# Patient Record
Sex: Female | Born: 1935
Health system: Southern US, Community
[De-identification: ages and names within clinical notes are randomized; demographics above are authoritative.]

## PROBLEM LIST (undated history)

## (undated) DIAGNOSIS — I10 Essential (primary) hypertension: Secondary | ICD-10-CM

## (undated) DIAGNOSIS — E119 Type 2 diabetes mellitus without complications: Secondary | ICD-10-CM

## (undated) DIAGNOSIS — R011 Cardiac murmur, unspecified: Secondary | ICD-10-CM

## (undated) DIAGNOSIS — I739 Peripheral vascular disease, unspecified: Secondary | ICD-10-CM

## (undated) DIAGNOSIS — G629 Polyneuropathy, unspecified: Secondary | ICD-10-CM

## (undated) DIAGNOSIS — E785 Hyperlipidemia, unspecified: Secondary | ICD-10-CM

## (undated) DIAGNOSIS — I340 Nonrheumatic mitral (valve) insufficiency: Secondary | ICD-10-CM

## (undated) HISTORY — DX: Cardiac murmur, unspecified: R01.1

## (undated) HISTORY — DX: Peripheral vascular disease, unspecified: I73.9

## (undated) HISTORY — DX: Essential (primary) hypertension: I10

## (undated) HISTORY — DX: Type 2 diabetes mellitus without complications: E11.9

## (undated) HISTORY — DX: Hyperlipidemia, unspecified: E78.5

## (undated) HISTORY — DX: Polyneuropathy, unspecified: G62.9

## (undated) HISTORY — DX: Nonrheumatic mitral (valve) insufficiency: I34.0

---
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
## 1998-09-27 ENCOUNTER — Emergency Department (HOSPITAL_COMMUNITY): Admission: EM | Admit: 1998-09-27 | Discharge: 1998-09-27 | Payer: Self-pay | Admitting: Emergency Medicine

## 1998-09-28 ENCOUNTER — Encounter: Payer: Self-pay | Admitting: Family Medicine

## 1998-09-28 ENCOUNTER — Ambulatory Visit (HOSPITAL_COMMUNITY): Admission: RE | Admit: 1998-09-28 | Discharge: 1998-09-28 | Payer: Self-pay | Admitting: Family Medicine

## 2000-10-29 ENCOUNTER — Encounter: Admission: RE | Admit: 2000-10-29 | Discharge: 2001-01-27 | Payer: Self-pay | Admitting: Internal Medicine

## 2001-02-25 ENCOUNTER — Ambulatory Visit: Admission: RE | Admit: 2001-02-25 | Discharge: 2001-02-25 | Payer: Self-pay | Admitting: Internal Medicine

## 2003-10-24 ENCOUNTER — Other Ambulatory Visit: Admission: RE | Admit: 2003-10-24 | Discharge: 2003-10-24 | Payer: Self-pay | Admitting: Internal Medicine

## 2003-10-26 ENCOUNTER — Encounter: Admission: RE | Admit: 2003-10-26 | Discharge: 2003-10-26 | Payer: Self-pay | Admitting: Internal Medicine

## 2003-11-03 ENCOUNTER — Ambulatory Visit (HOSPITAL_COMMUNITY): Admission: RE | Admit: 2003-11-03 | Discharge: 2003-11-03 | Payer: Self-pay | Admitting: Internal Medicine

## 2003-11-16 ENCOUNTER — Ambulatory Visit (HOSPITAL_COMMUNITY): Admission: RE | Admit: 2003-11-16 | Discharge: 2003-11-16 | Payer: Self-pay | Admitting: Internal Medicine

## 2003-11-16 ENCOUNTER — Encounter (INDEPENDENT_AMBULATORY_CARE_PROVIDER_SITE_OTHER): Payer: Self-pay | Admitting: *Deleted

## 2004-11-09 ENCOUNTER — Ambulatory Visit (HOSPITAL_COMMUNITY): Admission: RE | Admit: 2004-11-09 | Discharge: 2004-11-09 | Payer: Self-pay | Admitting: Internal Medicine

## 2005-11-20 ENCOUNTER — Ambulatory Visit (HOSPITAL_COMMUNITY): Admission: RE | Admit: 2005-11-20 | Discharge: 2005-11-20 | Payer: Self-pay | Admitting: *Deleted

## 2006-05-19 ENCOUNTER — Encounter: Admission: RE | Admit: 2006-05-19 | Discharge: 2006-05-19 | Payer: Self-pay | Admitting: *Deleted

## 2006-11-25 ENCOUNTER — Encounter: Admission: RE | Admit: 2006-11-25 | Discharge: 2006-11-25 | Payer: Self-pay | Admitting: Family Medicine

## 2009-08-08 ENCOUNTER — Encounter: Admission: RE | Admit: 2009-08-08 | Discharge: 2009-08-08 | Payer: Self-pay | Admitting: *Deleted

## 2011-03-21 DIAGNOSIS — E049 Nontoxic goiter, unspecified: Secondary | ICD-10-CM | POA: Diagnosis not present

## 2011-03-21 DIAGNOSIS — I10 Essential (primary) hypertension: Secondary | ICD-10-CM | POA: Diagnosis not present

## 2011-03-21 DIAGNOSIS — E119 Type 2 diabetes mellitus without complications: Secondary | ICD-10-CM | POA: Diagnosis not present

## 2011-03-21 DIAGNOSIS — Z79899 Other long term (current) drug therapy: Secondary | ICD-10-CM | POA: Diagnosis not present

## 2011-03-21 DIAGNOSIS — E782 Mixed hyperlipidemia: Secondary | ICD-10-CM | POA: Diagnosis not present

## 2011-03-21 DIAGNOSIS — J309 Allergic rhinitis, unspecified: Secondary | ICD-10-CM | POA: Diagnosis not present

## 2011-09-18 DIAGNOSIS — Z1239 Encounter for other screening for malignant neoplasm of breast: Secondary | ICD-10-CM | POA: Diagnosis not present

## 2011-09-18 DIAGNOSIS — I10 Essential (primary) hypertension: Secondary | ICD-10-CM | POA: Diagnosis not present

## 2011-09-18 DIAGNOSIS — Z13820 Encounter for screening for osteoporosis: Secondary | ICD-10-CM | POA: Diagnosis not present

## 2011-09-18 DIAGNOSIS — Z79899 Other long term (current) drug therapy: Secondary | ICD-10-CM | POA: Diagnosis not present

## 2011-09-18 DIAGNOSIS — E782 Mixed hyperlipidemia: Secondary | ICD-10-CM | POA: Diagnosis not present

## 2011-09-18 DIAGNOSIS — E119 Type 2 diabetes mellitus without complications: Secondary | ICD-10-CM | POA: Diagnosis not present

## 2011-10-23 DIAGNOSIS — Z23 Encounter for immunization: Secondary | ICD-10-CM | POA: Diagnosis not present

## 2011-11-11 DIAGNOSIS — J0100 Acute maxillary sinusitis, unspecified: Secondary | ICD-10-CM | POA: Diagnosis not present

## 2012-03-20 DIAGNOSIS — E782 Mixed hyperlipidemia: Secondary | ICD-10-CM | POA: Diagnosis not present

## 2012-03-20 DIAGNOSIS — E559 Vitamin D deficiency, unspecified: Secondary | ICD-10-CM | POA: Diagnosis not present

## 2012-03-20 DIAGNOSIS — Z79899 Other long term (current) drug therapy: Secondary | ICD-10-CM | POA: Diagnosis not present

## 2012-03-20 DIAGNOSIS — IMO0001 Reserved for inherently not codable concepts without codable children: Secondary | ICD-10-CM | POA: Diagnosis not present

## 2012-03-20 DIAGNOSIS — I10 Essential (primary) hypertension: Secondary | ICD-10-CM | POA: Diagnosis not present

## 2012-03-20 DIAGNOSIS — R82998 Other abnormal findings in urine: Secondary | ICD-10-CM | POA: Diagnosis not present

## 2012-04-14 DIAGNOSIS — R946 Abnormal results of thyroid function studies: Secondary | ICD-10-CM | POA: Diagnosis not present

## 2012-09-22 DIAGNOSIS — R209 Unspecified disturbances of skin sensation: Secondary | ICD-10-CM | POA: Diagnosis not present

## 2012-09-22 DIAGNOSIS — E119 Type 2 diabetes mellitus without complications: Secondary | ICD-10-CM | POA: Diagnosis not present

## 2012-09-22 DIAGNOSIS — R82998 Other abnormal findings in urine: Secondary | ICD-10-CM | POA: Diagnosis not present

## 2012-09-22 DIAGNOSIS — Z23 Encounter for immunization: Secondary | ICD-10-CM | POA: Diagnosis not present

## 2012-09-22 DIAGNOSIS — E785 Hyperlipidemia, unspecified: Secondary | ICD-10-CM | POA: Diagnosis not present

## 2012-09-22 DIAGNOSIS — Z79899 Other long term (current) drug therapy: Secondary | ICD-10-CM | POA: Diagnosis not present

## 2012-09-22 DIAGNOSIS — I10 Essential (primary) hypertension: Secondary | ICD-10-CM | POA: Diagnosis not present

## 2013-03-15 DIAGNOSIS — J019 Acute sinusitis, unspecified: Secondary | ICD-10-CM | POA: Diagnosis not present

## 2013-03-15 DIAGNOSIS — E782 Mixed hyperlipidemia: Secondary | ICD-10-CM | POA: Diagnosis not present

## 2013-03-15 DIAGNOSIS — E119 Type 2 diabetes mellitus without complications: Secondary | ICD-10-CM | POA: Diagnosis not present

## 2013-03-15 DIAGNOSIS — Z79899 Other long term (current) drug therapy: Secondary | ICD-10-CM | POA: Diagnosis not present

## 2013-03-15 DIAGNOSIS — E049 Nontoxic goiter, unspecified: Secondary | ICD-10-CM | POA: Diagnosis not present

## 2013-03-15 DIAGNOSIS — I10 Essential (primary) hypertension: Secondary | ICD-10-CM | POA: Diagnosis not present

## 2013-09-13 DIAGNOSIS — Z79899 Other long term (current) drug therapy: Secondary | ICD-10-CM | POA: Diagnosis not present

## 2013-09-13 DIAGNOSIS — E119 Type 2 diabetes mellitus without complications: Secondary | ICD-10-CM | POA: Diagnosis not present

## 2013-09-13 DIAGNOSIS — R209 Unspecified disturbances of skin sensation: Secondary | ICD-10-CM | POA: Diagnosis not present

## 2013-09-13 DIAGNOSIS — E049 Nontoxic goiter, unspecified: Secondary | ICD-10-CM | POA: Diagnosis not present

## 2013-09-13 DIAGNOSIS — R82998 Other abnormal findings in urine: Secondary | ICD-10-CM | POA: Diagnosis not present

## 2013-09-13 DIAGNOSIS — E785 Hyperlipidemia, unspecified: Secondary | ICD-10-CM | POA: Diagnosis not present

## 2013-09-13 DIAGNOSIS — I10 Essential (primary) hypertension: Secondary | ICD-10-CM | POA: Diagnosis not present

## 2013-10-15 DIAGNOSIS — Z23 Encounter for immunization: Secondary | ICD-10-CM | POA: Diagnosis not present

## 2014-03-21 DIAGNOSIS — E049 Nontoxic goiter, unspecified: Secondary | ICD-10-CM | POA: Diagnosis not present

## 2014-03-21 DIAGNOSIS — E119 Type 2 diabetes mellitus without complications: Secondary | ICD-10-CM | POA: Diagnosis not present

## 2014-03-21 DIAGNOSIS — I10 Essential (primary) hypertension: Secondary | ICD-10-CM | POA: Diagnosis not present

## 2014-03-21 DIAGNOSIS — E782 Mixed hyperlipidemia: Secondary | ICD-10-CM | POA: Diagnosis not present

## 2014-03-21 DIAGNOSIS — Z79899 Other long term (current) drug therapy: Secondary | ICD-10-CM | POA: Diagnosis not present

## 2014-09-06 DIAGNOSIS — J019 Acute sinusitis, unspecified: Secondary | ICD-10-CM | POA: Diagnosis not present

## 2014-09-21 DIAGNOSIS — G629 Polyneuropathy, unspecified: Secondary | ICD-10-CM | POA: Diagnosis not present

## 2014-09-21 DIAGNOSIS — I10 Essential (primary) hypertension: Secondary | ICD-10-CM | POA: Diagnosis not present

## 2014-09-21 DIAGNOSIS — E782 Mixed hyperlipidemia: Secondary | ICD-10-CM | POA: Diagnosis not present

## 2014-09-21 DIAGNOSIS — Z23 Encounter for immunization: Secondary | ICD-10-CM | POA: Diagnosis not present

## 2014-09-21 DIAGNOSIS — Z79899 Other long term (current) drug therapy: Secondary | ICD-10-CM | POA: Diagnosis not present

## 2014-09-21 DIAGNOSIS — E1165 Type 2 diabetes mellitus with hyperglycemia: Secondary | ICD-10-CM | POA: Diagnosis not present

## 2014-09-27 ENCOUNTER — Other Ambulatory Visit: Payer: Self-pay | Admitting: Family Medicine

## 2014-09-27 DIAGNOSIS — R0989 Other specified symptoms and signs involving the circulatory and respiratory systems: Secondary | ICD-10-CM

## 2014-09-29 ENCOUNTER — Ambulatory Visit
Admission: RE | Admit: 2014-09-29 | Discharge: 2014-09-29 | Disposition: A | Discharge status: Home / Self Care | Payer: Medicare Other | Source: Ambulatory Visit | Attending: Family Medicine | Admitting: Family Medicine

## 2014-09-29 DIAGNOSIS — R0989 Other specified symptoms and signs involving the circulatory and respiratory systems: Secondary | ICD-10-CM

## 2015-03-23 DIAGNOSIS — E139 Other specified diabetes mellitus without complications: Secondary | ICD-10-CM | POA: Diagnosis not present

## 2015-03-23 DIAGNOSIS — I10 Essential (primary) hypertension: Secondary | ICD-10-CM | POA: Diagnosis not present

## 2015-03-23 DIAGNOSIS — E782 Mixed hyperlipidemia: Secondary | ICD-10-CM | POA: Diagnosis not present

## 2015-03-23 DIAGNOSIS — Z79899 Other long term (current) drug therapy: Secondary | ICD-10-CM | POA: Diagnosis not present

## 2015-03-23 DIAGNOSIS — R011 Cardiac murmur, unspecified: Secondary | ICD-10-CM | POA: Diagnosis not present

## 2015-03-23 DIAGNOSIS — G629 Polyneuropathy, unspecified: Secondary | ICD-10-CM | POA: Diagnosis not present

## 2015-04-11 DIAGNOSIS — H40013 Open angle with borderline findings, low risk, bilateral: Secondary | ICD-10-CM | POA: Diagnosis not present

## 2015-04-11 DIAGNOSIS — E11319 Type 2 diabetes mellitus with unspecified diabetic retinopathy without macular edema: Secondary | ICD-10-CM | POA: Diagnosis not present

## 2015-04-11 DIAGNOSIS — H2513 Age-related nuclear cataract, bilateral: Secondary | ICD-10-CM | POA: Diagnosis not present

## 2015-04-11 DIAGNOSIS — H25013 Cortical age-related cataract, bilateral: Secondary | ICD-10-CM | POA: Diagnosis not present

## 2015-04-11 DIAGNOSIS — E119 Type 2 diabetes mellitus without complications: Secondary | ICD-10-CM | POA: Diagnosis not present

## 2015-04-12 DIAGNOSIS — I10 Essential (primary) hypertension: Secondary | ICD-10-CM | POA: Diagnosis not present

## 2015-04-12 DIAGNOSIS — E782 Mixed hyperlipidemia: Secondary | ICD-10-CM | POA: Diagnosis not present

## 2015-04-12 DIAGNOSIS — R011 Cardiac murmur, unspecified: Secondary | ICD-10-CM | POA: Diagnosis not present

## 2015-04-12 DIAGNOSIS — I739 Peripheral vascular disease, unspecified: Secondary | ICD-10-CM | POA: Diagnosis not present

## 2015-04-27 DIAGNOSIS — R011 Cardiac murmur, unspecified: Secondary | ICD-10-CM | POA: Diagnosis not present

## 2015-05-02 DIAGNOSIS — M79672 Pain in left foot: Secondary | ICD-10-CM | POA: Diagnosis not present

## 2015-05-02 DIAGNOSIS — M722 Plantar fascial fibromatosis: Secondary | ICD-10-CM | POA: Diagnosis not present

## 2015-05-02 DIAGNOSIS — M7732 Calcaneal spur, left foot: Secondary | ICD-10-CM | POA: Diagnosis not present

## 2015-07-04 DIAGNOSIS — H18411 Arcus senilis, right eye: Secondary | ICD-10-CM | POA: Diagnosis not present

## 2015-07-04 DIAGNOSIS — H43823 Vitreomacular adhesion, bilateral: Secondary | ICD-10-CM | POA: Diagnosis not present

## 2015-07-04 DIAGNOSIS — H2512 Age-related nuclear cataract, left eye: Secondary | ICD-10-CM | POA: Diagnosis not present

## 2015-07-04 DIAGNOSIS — H18412 Arcus senilis, left eye: Secondary | ICD-10-CM | POA: Diagnosis not present

## 2015-07-04 DIAGNOSIS — H2511 Age-related nuclear cataract, right eye: Secondary | ICD-10-CM | POA: Diagnosis not present

## 2015-07-17 DIAGNOSIS — H2512 Age-related nuclear cataract, left eye: Secondary | ICD-10-CM | POA: Diagnosis not present

## 2015-07-18 DIAGNOSIS — H2511 Age-related nuclear cataract, right eye: Secondary | ICD-10-CM | POA: Diagnosis not present

## 2015-08-04 DIAGNOSIS — H2511 Age-related nuclear cataract, right eye: Secondary | ICD-10-CM | POA: Diagnosis not present

## 2015-10-04 DIAGNOSIS — E119 Type 2 diabetes mellitus without complications: Secondary | ICD-10-CM | POA: Diagnosis not present

## 2015-10-04 DIAGNOSIS — Z7984 Long term (current) use of oral hypoglycemic drugs: Secondary | ICD-10-CM | POA: Diagnosis not present

## 2015-10-04 DIAGNOSIS — E1165 Type 2 diabetes mellitus with hyperglycemia: Secondary | ICD-10-CM | POA: Diagnosis not present

## 2015-10-04 DIAGNOSIS — Z23 Encounter for immunization: Secondary | ICD-10-CM | POA: Diagnosis not present

## 2015-10-04 DIAGNOSIS — E782 Mixed hyperlipidemia: Secondary | ICD-10-CM | POA: Diagnosis not present

## 2015-10-04 DIAGNOSIS — Z79899 Other long term (current) drug therapy: Secondary | ICD-10-CM | POA: Diagnosis not present

## 2015-10-04 DIAGNOSIS — I10 Essential (primary) hypertension: Secondary | ICD-10-CM | POA: Diagnosis not present

## 2015-10-04 DIAGNOSIS — G629 Polyneuropathy, unspecified: Secondary | ICD-10-CM | POA: Diagnosis not present

## 2015-10-30 IMAGING — US US CAROTID DUPLEX BILAT
1 series · 14 of 24 positions shown · non-contrast
Comparison: None.

CLINICAL DATA: Left-sided carotid bruit.

EXAM:
BILATERAL CAROTID DUPLEX ULTRASOUND
TECHNIQUE: Gray scale imaging, color Doppler and duplex ultrasound were
performed of bilateral carotid and vertebral arteries in the neck.

[Series 1: us carotid duplex bilat · 0.08mm/px · 14 of 72 slices shown]
[im 1/72]
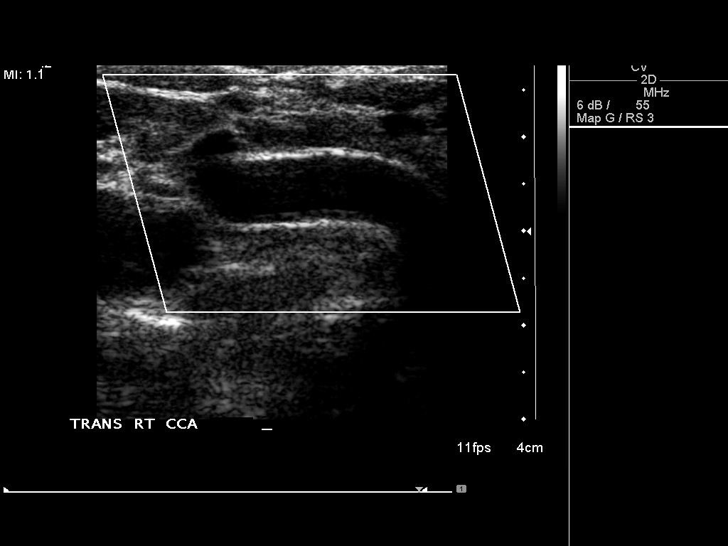
[im 7/72]
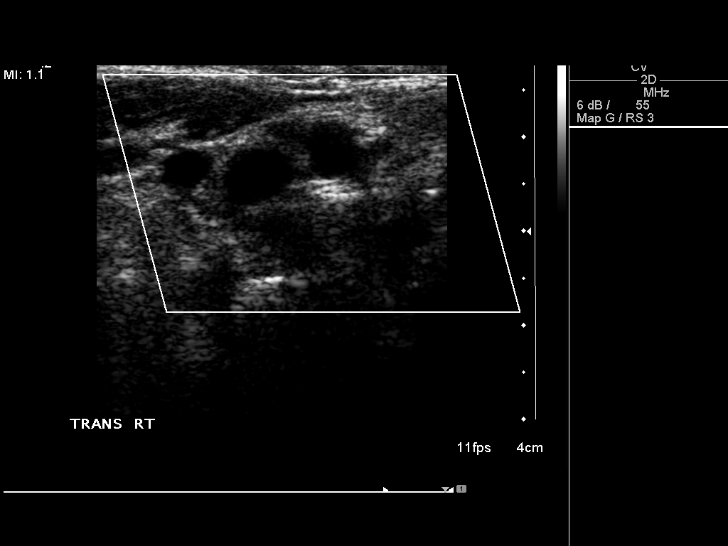
[im 13/72]
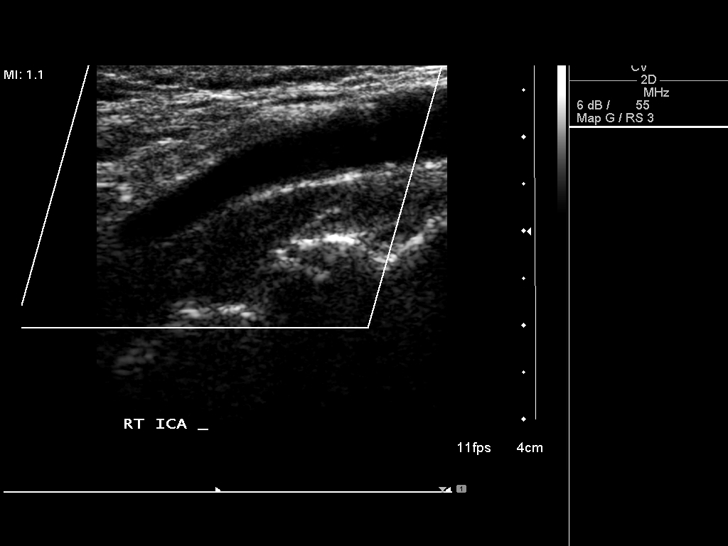
[im 19/72]
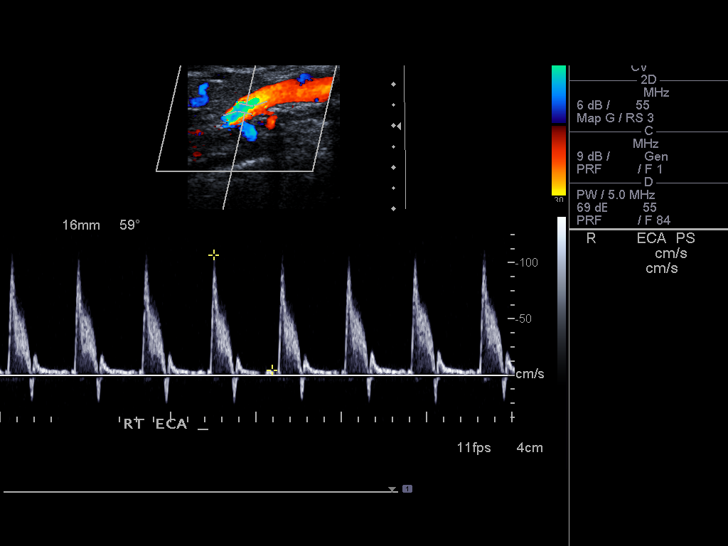
[im 22/72]
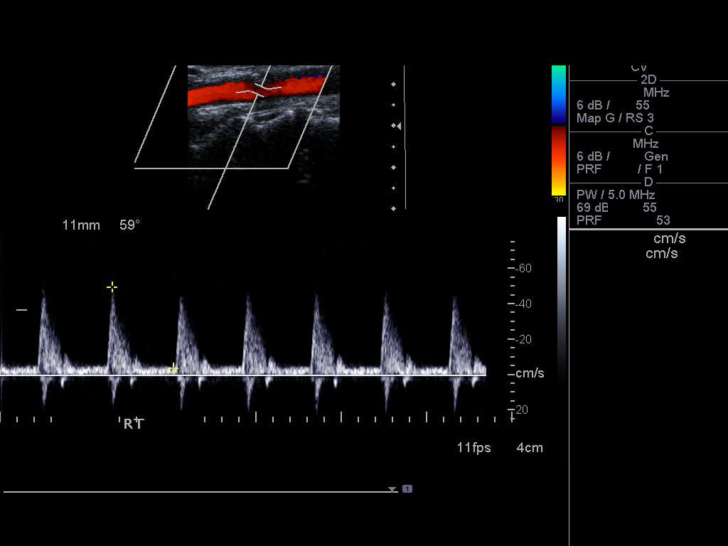
[im 28/72]
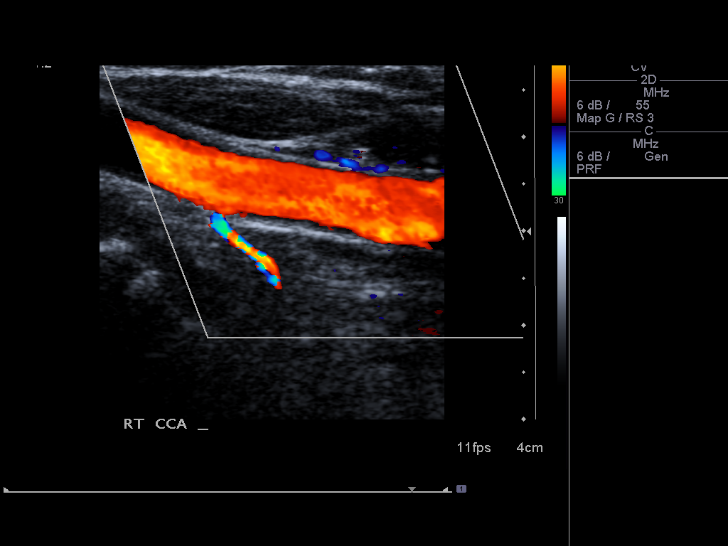
[im 34/72]
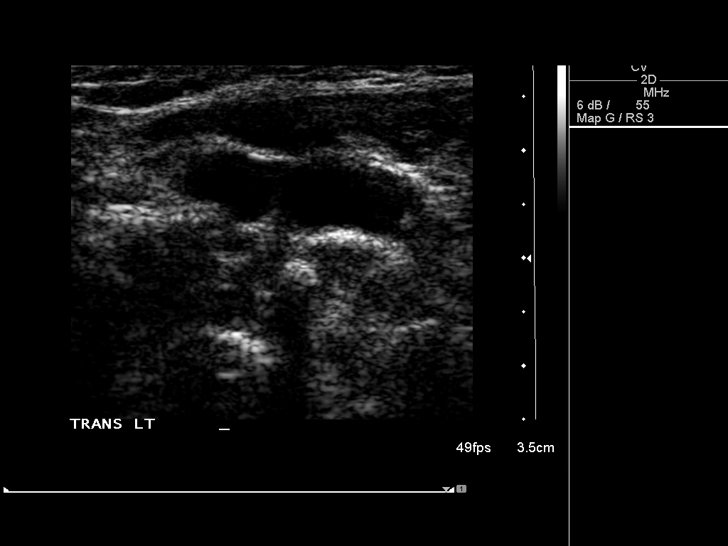
[im 38/72]
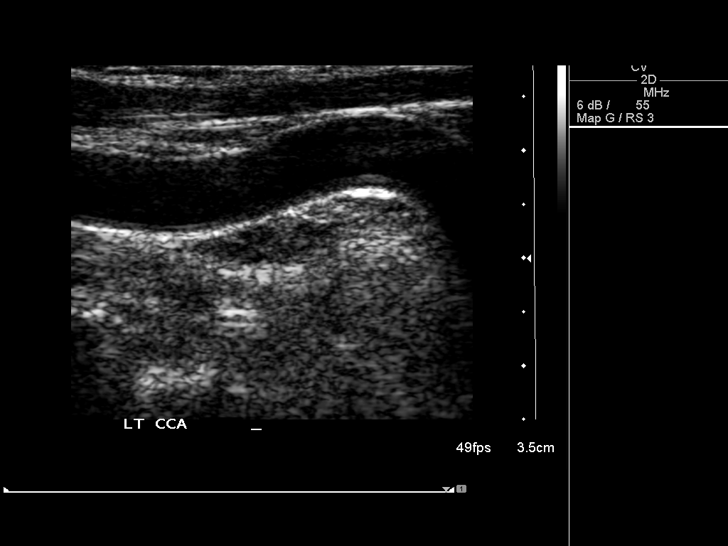
[im 44/72]
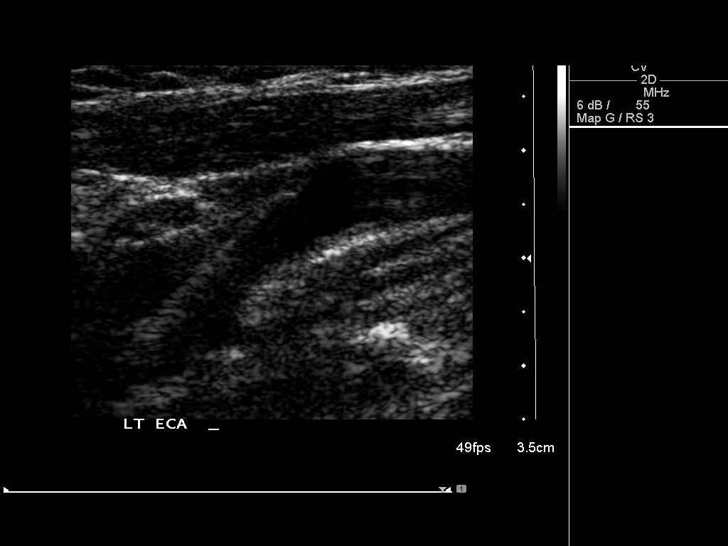
[im 50/72]
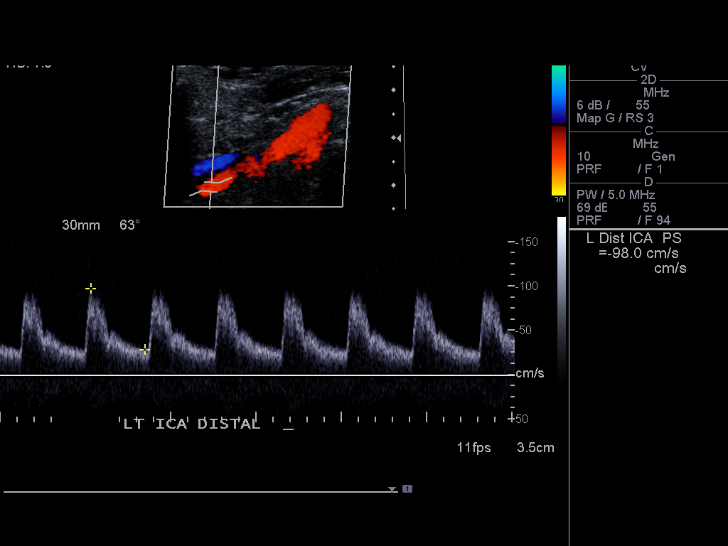
[im 56/72]
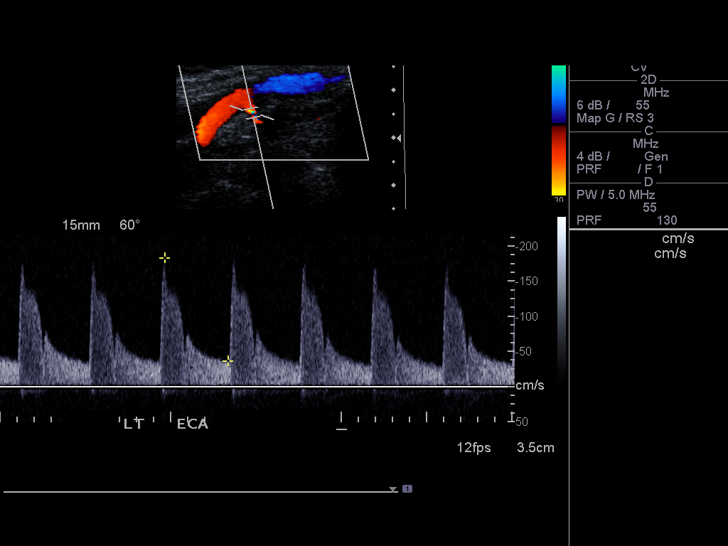
[im 59/72]
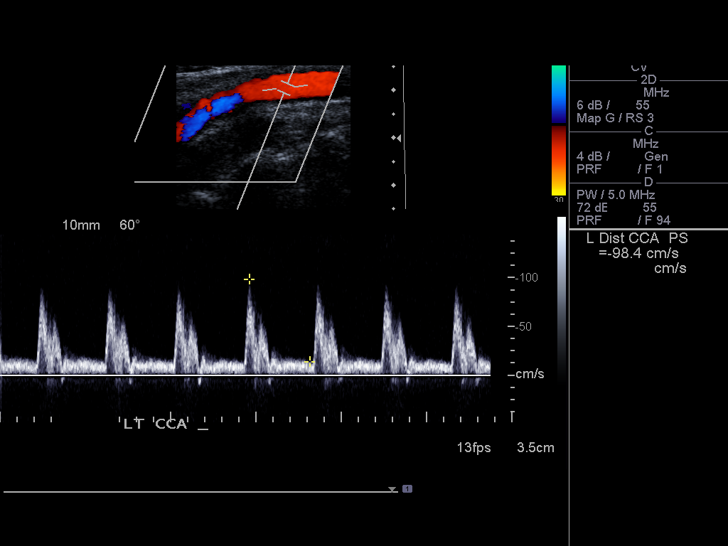
[im 65/72]
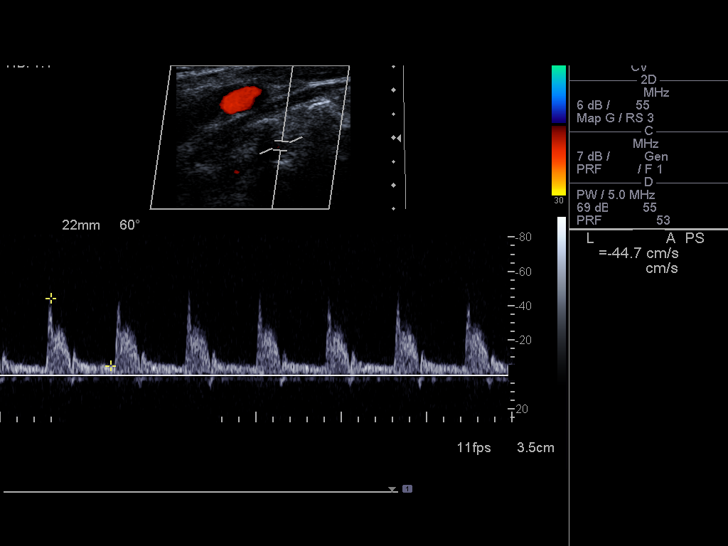
[im 72/72]
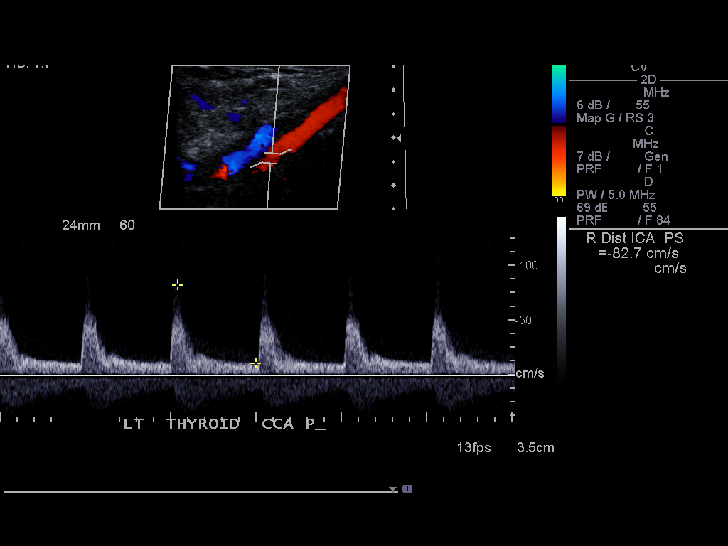

[14 of 24 positions shown; findings below may reference images not displayed]

FINDINGS: Criteria: Quantification of carotid stenosis is based on velocity
parameters that correlate the residual internal carotid diameter
with NASCET-based stenosis levels, using the diameter of the distal
internal carotid lumen as the denominator for stenosis measurement.

The following velocity measurements were obtained:

RIGHT

ICA:  61/16 cm/sec

CCA:  87/5 cm/sec

SYSTOLIC ICA/CCA RATIO:

DIASTOLIC ICA/CCA RATIO:

ECA:  107 cm/sec

LEFT

ICA:  101/30 cm/sec

CCA:  123/14 cm/sec

SYSTOLIC ICA/CCA RATIO:

DIASTOLIC ICA/CCA RATIO:

ECA:  92 cm/sec

RIGHT CAROTID ARTERY: No significant plaque or stenosis is noted.

RIGHT VERTEBRAL ARTERY:  Antegrade flow is noted.

LEFT CAROTID ARTERY:  No significant plaque or stenosis is noted.

LEFT VERTEBRAL ARTERY:  Antegrade flow is noted.
IMPRESSION: No hemodynamically significant stenosis or plaque is noted in either
cervical carotid artery.

## 2015-12-01 DIAGNOSIS — H401134 Primary open-angle glaucoma, bilateral, indeterminate stage: Secondary | ICD-10-CM | POA: Diagnosis not present

## 2016-04-08 DIAGNOSIS — I10 Essential (primary) hypertension: Secondary | ICD-10-CM | POA: Diagnosis not present

## 2016-04-08 DIAGNOSIS — Z8639 Personal history of other endocrine, nutritional and metabolic disease: Secondary | ICD-10-CM | POA: Diagnosis not present

## 2016-04-08 DIAGNOSIS — E782 Mixed hyperlipidemia: Secondary | ICD-10-CM | POA: Diagnosis not present

## 2016-04-08 DIAGNOSIS — Z79899 Other long term (current) drug therapy: Secondary | ICD-10-CM | POA: Diagnosis not present

## 2016-04-08 DIAGNOSIS — E1142 Type 2 diabetes mellitus with diabetic polyneuropathy: Secondary | ICD-10-CM | POA: Diagnosis not present

## 2016-04-08 DIAGNOSIS — Z7984 Long term (current) use of oral hypoglycemic drugs: Secondary | ICD-10-CM | POA: Diagnosis not present

## 2016-04-08 DIAGNOSIS — Z23 Encounter for immunization: Secondary | ICD-10-CM | POA: Diagnosis not present

## 2016-05-02 DIAGNOSIS — R011 Cardiac murmur, unspecified: Secondary | ICD-10-CM | POA: Diagnosis not present

## 2016-05-02 DIAGNOSIS — I10 Essential (primary) hypertension: Secondary | ICD-10-CM | POA: Diagnosis not present

## 2016-05-03 DIAGNOSIS — E119 Type 2 diabetes mellitus without complications: Secondary | ICD-10-CM | POA: Diagnosis not present

## 2016-05-07 DIAGNOSIS — R944 Abnormal results of kidney function studies: Secondary | ICD-10-CM | POA: Diagnosis not present

## 2016-05-27 DIAGNOSIS — I739 Peripheral vascular disease, unspecified: Secondary | ICD-10-CM | POA: Diagnosis not present

## 2016-05-27 DIAGNOSIS — I340 Nonrheumatic mitral (valve) insufficiency: Secondary | ICD-10-CM | POA: Diagnosis not present

## 2016-05-27 DIAGNOSIS — E782 Mixed hyperlipidemia: Secondary | ICD-10-CM | POA: Diagnosis not present

## 2016-05-27 DIAGNOSIS — I10 Essential (primary) hypertension: Secondary | ICD-10-CM | POA: Diagnosis not present

## 2016-10-01 DIAGNOSIS — E1142 Type 2 diabetes mellitus with diabetic polyneuropathy: Secondary | ICD-10-CM | POA: Diagnosis not present

## 2016-10-01 DIAGNOSIS — I10 Essential (primary) hypertension: Secondary | ICD-10-CM | POA: Diagnosis not present

## 2016-10-01 DIAGNOSIS — Z7984 Long term (current) use of oral hypoglycemic drugs: Secondary | ICD-10-CM | POA: Diagnosis not present

## 2016-10-01 DIAGNOSIS — E7800 Pure hypercholesterolemia, unspecified: Secondary | ICD-10-CM | POA: Diagnosis not present

## 2016-10-01 DIAGNOSIS — Z23 Encounter for immunization: Secondary | ICD-10-CM | POA: Diagnosis not present

## 2016-10-28 DIAGNOSIS — E7800 Pure hypercholesterolemia, unspecified: Secondary | ICD-10-CM | POA: Insufficient documentation

## 2016-10-28 DIAGNOSIS — I10 Essential (primary) hypertension: Secondary | ICD-10-CM

## 2016-10-28 DIAGNOSIS — L97909 Non-pressure chronic ulcer of unspecified part of unspecified lower leg with unspecified severity: Secondary | ICD-10-CM | POA: Diagnosis not present

## 2016-10-28 DIAGNOSIS — E119 Type 2 diabetes mellitus without complications: Secondary | ICD-10-CM | POA: Insufficient documentation

## 2016-10-28 DIAGNOSIS — E785 Hyperlipidemia, unspecified: Secondary | ICD-10-CM

## 2016-10-28 DIAGNOSIS — I119 Hypertensive heart disease without heart failure: Secondary | ICD-10-CM | POA: Insufficient documentation

## 2016-11-07 DIAGNOSIS — H401131 Primary open-angle glaucoma, bilateral, mild stage: Secondary | ICD-10-CM | POA: Diagnosis not present

## 2016-11-08 ENCOUNTER — Ambulatory Visit (INDEPENDENT_AMBULATORY_CARE_PROVIDER_SITE_OTHER): Payer: Medicare Other | Admitting: Podiatry

## 2016-11-08 ENCOUNTER — Encounter: Payer: Self-pay | Admitting: Podiatry

## 2016-11-08 VITALS — BP 132/63 | HR 67 | Resp 16

## 2016-11-08 DIAGNOSIS — E1151 Type 2 diabetes mellitus with diabetic peripheral angiopathy without gangrene: Secondary | ICD-10-CM

## 2016-11-08 DIAGNOSIS — Q828 Other specified congenital malformations of skin: Secondary | ICD-10-CM | POA: Diagnosis not present

## 2016-11-08 NOTE — Progress Notes (Signed)
  Subjective:  Patient ID: Angela Benitez, female    DOB: 08/13/1933,  MRN: [id number]  Chief Complaint  Patient presents with  . Foot Pain    Lateral 5th MPJ right - tender x 3 weeks, callused area initially, used medicated pad and started to pull the skin away, made very sore, keeping covered with bandaid    83 y.o. female presents with the above complaint. Portal painful callus to the outside of her right foot. Has been using medicated corn pads and presumably gentian violet  (unsure exactly, calls the solution "purple stuff") as an antiseptic. States the area is very sore.   No past medical history on file. No past surgical history on file.  Current Outpatient Prescriptions:  .  cephALEXin (KEFLEX) 500 MG capsule, , Disp: , Rfl:  .  fluticasone (FLONASE) 50 MCG/ACT nasal spray, fluticasone 50 mcg/actuation nasal spray,suspension, Disp: , Rfl:  .  fosinopril (MONOPRIL) 40 MG tablet, fosinopril 40 mg tablet, Disp: , Rfl:  .  gabapentin (NEURONTIN) 100 MG capsule, gabapentin 100 mg capsule, Disp: , Rfl:  .  glimepiride (AMARYL) 2 MG tablet, glimepiride 2 mg tablet, Disp: , Rfl:  .  guaiFENesin (MUCINEX) 600 MG 12 hr tablet, Mucinex 600 mg tablet, extended release  Take 1 tablet every 12 hours by oral route., Disp: , Rfl:  .  hydrochlorothiazide (HYDRODIURIL) 25 MG tablet, hydrochlorothiazide 25 mg tablet, Disp: , Rfl:  .  metFORMIN (GLUCOPHAGE) 1000 MG tablet, metformin 1,000 mg tablet, Disp: , Rfl:  .  metoprolol tartrate (LOPRESSOR) 50 MG tablet, metoprolol tartrate 50 mg tablet, Disp: , Rfl:  .  naproxen (NAPROSYN) 500 MG tablet, naproxen 500 mg tablet  TAKE ONE TABLET BY MOUTH TWICE DAILY, Disp: , Rfl:  .  pravastatin (PRAVACHOL) 20 MG tablet, pravastatin 20 mg tablet, Disp: , Rfl:   Allergies  Allergen Reactions  . No Known Allergies    Review of Systems  All other systems reviewed and are negative.  Objective:   Vitals:   11/08/16 0736  BP: 132/63  Pulse: 67  Resp: 16    General AA&O x3. Normal mood and affect.  Vascular Dorsalis pedis and posterior tibial pulses  present 1+ and absent bilaterally  Capillary refill normal to all digits. Pedal hair growth absent.  Neurologic Epicritic sensation grossly present.  Dermatologic R 5th MPJ plantar keratosis. Interspaces clear of maceration. Nails well groomed and normal in appearance.  Orthopedic: MMT 5/5 in dorsiflexion, plantarflexion, inversion, and eversion. Normal joint ROM without pain or crepitus. Tailor's bunion R 5th MPJ    Assessment & Plan:  Patient was evaluated and treated and all questions answered.  Tailor's Bunion with Callus R 5th MPJ -Lesion debrided today due to decreased pulses. Educated on self-care. -Silicone tailor's bunion dispensed. Educated on application. -F/u in 3 months for DM foot check.

## 2017-04-07 DIAGNOSIS — I10 Essential (primary) hypertension: Secondary | ICD-10-CM | POA: Diagnosis not present

## 2017-04-07 DIAGNOSIS — E119 Type 2 diabetes mellitus without complications: Secondary | ICD-10-CM | POA: Diagnosis not present

## 2017-04-07 DIAGNOSIS — N183 Chronic kidney disease, stage 3 (moderate): Secondary | ICD-10-CM | POA: Diagnosis not present

## 2017-04-07 DIAGNOSIS — E7800 Pure hypercholesterolemia, unspecified: Secondary | ICD-10-CM | POA: Diagnosis not present

## 2017-04-07 DIAGNOSIS — E1142 Type 2 diabetes mellitus with diabetic polyneuropathy: Secondary | ICD-10-CM | POA: Diagnosis not present

## 2017-04-07 DIAGNOSIS — Z7984 Long term (current) use of oral hypoglycemic drugs: Secondary | ICD-10-CM | POA: Diagnosis not present

## 2017-05-26 DIAGNOSIS — I739 Peripheral vascular disease, unspecified: Secondary | ICD-10-CM | POA: Diagnosis not present

## 2017-05-26 DIAGNOSIS — I340 Nonrheumatic mitral (valve) insufficiency: Secondary | ICD-10-CM | POA: Diagnosis not present

## 2017-05-26 DIAGNOSIS — I10 Essential (primary) hypertension: Secondary | ICD-10-CM | POA: Diagnosis not present

## 2017-05-26 DIAGNOSIS — E782 Mixed hyperlipidemia: Secondary | ICD-10-CM | POA: Diagnosis not present

## 2017-05-27 DIAGNOSIS — E119 Type 2 diabetes mellitus without complications: Secondary | ICD-10-CM | POA: Diagnosis not present

## 2017-06-02 DIAGNOSIS — E119 Type 2 diabetes mellitus without complications: Secondary | ICD-10-CM | POA: Diagnosis not present

## 2017-06-02 DIAGNOSIS — Z7984 Long term (current) use of oral hypoglycemic drugs: Secondary | ICD-10-CM | POA: Diagnosis not present

## 2017-06-02 DIAGNOSIS — I10 Essential (primary) hypertension: Secondary | ICD-10-CM | POA: Diagnosis not present

## 2017-08-05 DIAGNOSIS — E1142 Type 2 diabetes mellitus with diabetic polyneuropathy: Secondary | ICD-10-CM | POA: Diagnosis not present

## 2017-09-24 DIAGNOSIS — I739 Peripheral vascular disease, unspecified: Secondary | ICD-10-CM | POA: Diagnosis not present

## 2017-11-06 DIAGNOSIS — Z23 Encounter for immunization: Secondary | ICD-10-CM | POA: Diagnosis not present

## 2017-11-19 DIAGNOSIS — Z0000 Encounter for general adult medical examination without abnormal findings: Secondary | ICD-10-CM | POA: Diagnosis not present

## 2017-11-19 DIAGNOSIS — I10 Essential (primary) hypertension: Secondary | ICD-10-CM | POA: Diagnosis not present

## 2017-11-19 DIAGNOSIS — E7800 Pure hypercholesterolemia, unspecified: Secondary | ICD-10-CM | POA: Diagnosis not present

## 2017-11-19 DIAGNOSIS — Z7984 Long term (current) use of oral hypoglycemic drugs: Secondary | ICD-10-CM | POA: Diagnosis not present

## 2017-11-19 DIAGNOSIS — N183 Chronic kidney disease, stage 3 (moderate): Secondary | ICD-10-CM | POA: Diagnosis not present

## 2017-11-19 DIAGNOSIS — E119 Type 2 diabetes mellitus without complications: Secondary | ICD-10-CM | POA: Diagnosis not present

## 2017-11-19 DIAGNOSIS — E1142 Type 2 diabetes mellitus with diabetic polyneuropathy: Secondary | ICD-10-CM | POA: Diagnosis not present

## 2017-11-19 DIAGNOSIS — M8588 Other specified disorders of bone density and structure, other site: Secondary | ICD-10-CM | POA: Diagnosis not present

## 2017-11-24 ENCOUNTER — Other Ambulatory Visit: Payer: Self-pay | Admitting: Family Medicine

## 2017-11-24 DIAGNOSIS — M8580 Other specified disorders of bone density and structure, unspecified site: Secondary | ICD-10-CM

## 2017-11-27 DIAGNOSIS — H401131 Primary open-angle glaucoma, bilateral, mild stage: Secondary | ICD-10-CM | POA: Diagnosis not present

## 2018-05-25 ENCOUNTER — Ambulatory Visit: Payer: Self-pay | Admitting: Cardiology

## 2018-05-25 DIAGNOSIS — E1142 Type 2 diabetes mellitus with diabetic polyneuropathy: Secondary | ICD-10-CM | POA: Diagnosis not present

## 2018-05-25 DIAGNOSIS — I10 Essential (primary) hypertension: Secondary | ICD-10-CM | POA: Diagnosis not present

## 2018-05-25 DIAGNOSIS — E7800 Pure hypercholesterolemia, unspecified: Secondary | ICD-10-CM | POA: Diagnosis not present

## 2018-05-25 DIAGNOSIS — Z7984 Long term (current) use of oral hypoglycemic drugs: Secondary | ICD-10-CM | POA: Diagnosis not present

## 2018-05-25 DIAGNOSIS — E1165 Type 2 diabetes mellitus with hyperglycemia: Secondary | ICD-10-CM | POA: Diagnosis not present

## 2018-06-02 DIAGNOSIS — J329 Chronic sinusitis, unspecified: Secondary | ICD-10-CM | POA: Diagnosis not present

## 2018-07-11 DIAGNOSIS — I739 Peripheral vascular disease, unspecified: Secondary | ICD-10-CM | POA: Insufficient documentation

## 2018-07-11 DIAGNOSIS — I340 Nonrheumatic mitral (valve) insufficiency: Secondary | ICD-10-CM | POA: Insufficient documentation

## 2018-07-11 NOTE — Progress Notes (Signed)
Subjective:  Primary Physician:  Clayborn Heronankins, Angela R, MD  Patient ID: Angela BostonBarbara T Tsan, female    DOB: 04-09-1933, 85 y.o.   MRN: [id number]  This visit type was conducted due to national recommendations for restrictions regarding the COVID-19 Pandemic (e.g. social distancing).  This format is felt to be most appropriate for this patient at this time.  All issues noted in this document were discussed and addressed.  No physical exam was performed (except for noted visual exam findings with Telehealth visits - very limited).  The patient has consented to conduct a Telehealth visit and understands insurance will be billed.   I connected with patient, on 07/13/18  by a telemedicine application and verified that I am speaking with the correct person using two identifiers.     I discussed the limitations of evaluation and management by telemedicine and the availability of in person appointments. The patient expressed understanding and agreed to proceed.   I have discussed with patient regarding the safety during COVID Pandemic and steps and precautions including social distancing with the patient.   No chief complaint on file.   HPI: Angela Benitez  is a 85 y.o. female. Patient has been feeling well. She denies any complaints of chest pain, chest tightness, shortness of breath, orthopnea or PND. No complaints of palpitation, dizziness, near-syncope or syncope. No history of swelling on the legs.   She complains of feeling tired in the legs and has heaviness in the legs on walking. Patient has not been walking much outdoors, she walks in the parking lot. She does stretching exercises at home.  Patient has hypertension, diabetes mellitus type 2 and hypercholesterolemia. She does not smoke.  No history of thyroid problems. No history of TIA or CVA.  Past Medical History:  Diagnosis Date  . Diabetes mellitus without complication (HCC)   . Hyperlipidemia   . Hypertension   . Mild mitral  regurgitation   . Murmur   . Neuropathy   . PAD (peripheral artery disease) (HCC)     History reviewed. No pertinent surgical history.  Social History   Socioeconomic History  . Marital status: Widowed    Spouse name: Not on file  . Number of children: 3  . Years of education: Not on file  . Highest education level: Not on file  Occupational History  . Not on file  Social Needs  . Financial resource strain: Not on file  . Food insecurity    Worry: Not on file    Inability: Not on file  . Transportation needs    Medical: Not on file    Non-medical: Not on file  Tobacco Use  . Smoking status: Never Smoker  . Smokeless tobacco: Never Used  Substance and Sexual Activity  . Alcohol use: No  . Drug use: Not on file  . Sexual activity: Not on file  Lifestyle  . Physical activity    Days per week: Not on file    Minutes per session: Not on file  . Stress: Not on file  Relationships  . Social Musicianconnections    Talks on phone: Not on file    Gets together: Not on file    Attends religious service: Not on file    Active member of club or organization: Not on file    Attends meetings of clubs or organizations: Not on file    Relationship status: Not on file  . Intimate partner violence    Fear of current or  ex partner: Not on file    Emotionally abused: Not on file    Physically abused: Not on file    Forced sexual activity: Not on file  Other Topics Concern  . Not on file  Social History Narrative  . Not on file    Current Outpatient Medications on File Prior to Visit  Medication Sig Dispense Refill  . aspirin EC 81 MG tablet Take 81 mg by mouth daily.    . bimatoprost (LUMIGAN) 0.01 % SOLN daily. Each eye    . fosinopril (MONOPRIL) 40 MG tablet fosinopril 40 mg tablet    . gabapentin (NEURONTIN) 100 MG capsule Take 100 mg by mouth. 2 @ bedtime    . glimepiride (AMARYL) 2 MG tablet 1.5 at breakfast, 1 @ bedtime    . hydrochlorothiazide (HYDRODIURIL) 25 MG tablet  hydrochlorothiazide 25 mg tablet    . metFORMIN (GLUCOPHAGE) 1000 MG tablet Take 1,000 mg by mouth 2 (two) times a day.     . metoprolol tartrate (LOPRESSOR) 50 MG tablet Take 50 mg by mouth daily.     . pravastatin (PRAVACHOL) 20 MG tablet Take 20 mg by mouth daily.      No current facility-administered medications on file prior to visit.     Review of Systems  Constitutional: Negative for fever.  HENT: Negative for nosebleeds.   Eyes: Negative for blurred vision.  Respiratory: Negative for cough and shortness of breath.   Cardiovascular: Positive for claudication. Negative for chest pain, palpitations and leg swelling.  Gastrointestinal: Negative for abdominal pain, nausea and vomiting.  Genitourinary: Negative for dysuria.  Musculoskeletal: Negative for myalgias.  Skin: Negative for itching and rash.  Neurological: Negative for dizziness, seizures and loss of consciousness.  Psychiatric/Behavioral: The patient is not nervous/anxious.        Objective:  Height 5\' 3"  (1.6 m), weight 135 lb (61.2 kg). Body mass index is 23.91 kg/m.  Physical Exam: Patient is alert and oriented, appeared very comfortable talking to me during the visit. No further detailed physical examination was possible as it was a telemedicine visit.  CARDIAC STUDIES:  Echocardiogram 05/02/2016: Left ventricle cavity is small. Cavitary obliteration noted and suspect intraventricular PG. Mild concentric remodeling of the left ventricle. Basal septal hypertrophy. Normal global wall motion. Doppler evidence of grade II (pseudonormal) diastolic dysfunction, elevated LAP. Calculated EF 74%. Mild (Grade I) mitral regurgitation. No evidence of mitral valve stenosis. Moderate tricuspid regurgitation. Mild pulmonary hypertension. Pulmonary artery systolic pressure is estimated at 33 mm Hg. Mild pulmonic regurgitation.  Assessment & Recommendations:   1. Hypertension with heart disease  2. Mild mitral regurgitation   3. Hypercholesterolemia  4. PAD (peripheral artery disease) (HCC)   Laboratory Exam:  No flowsheet data found. No flowsheet data found. Lipid Panel  No results found for: CHOL, TRIG, HDL, CHOLHDL, VLDL, LDLCALC, LDLDIRECT  Recommendation:  Cardiac status is stable, patient is essentially asymptomatic from cardiac standpoint. She has PAD. She was scheduled for lower arterial duplex study last year but patient did not get it done.  I will reschedule it again.  Blood pressure is fairly controlled with therapy. Patient has been checking blood pressures at home and it has been mostly normal. I have advised her to continue present medications for blood pressure, cholesterol as well as low-dose aspirin.  Primary prevention was again discussed. She was advised to follow ADA, low-salt, low-cholesterol diet and was encouraged to walk regularly as tolerated. She will continue to have all the blood tests including  liver-lipid panels at her PCP's office.  Return for follow-up after 1 year but call us earlier if there are any problems.   Despina Hick, MD, [facility] 07/13/2018, 11:29 AM [facility] [place] Pager: [phone] Office: [phone] If no answer Cell [phone]

## 2018-07-13 ENCOUNTER — Encounter: Payer: Self-pay | Admitting: Cardiology

## 2018-07-13 ENCOUNTER — Other Ambulatory Visit: Payer: Self-pay

## 2018-07-13 ENCOUNTER — Ambulatory Visit (INDEPENDENT_AMBULATORY_CARE_PROVIDER_SITE_OTHER): Payer: Medicare Other | Admitting: Cardiology

## 2018-07-13 VITALS — Ht 63.0 in | Wt 135.0 lb

## 2018-07-13 DIAGNOSIS — I739 Peripheral vascular disease, unspecified: Secondary | ICD-10-CM | POA: Diagnosis not present

## 2018-07-13 DIAGNOSIS — E7800 Pure hypercholesterolemia, unspecified: Secondary | ICD-10-CM

## 2018-07-13 DIAGNOSIS — I119 Hypertensive heart disease without heart failure: Secondary | ICD-10-CM

## 2018-07-13 DIAGNOSIS — I340 Nonrheumatic mitral (valve) insufficiency: Secondary | ICD-10-CM | POA: Diagnosis not present

## 2018-08-21 ENCOUNTER — Other Ambulatory Visit: Payer: Self-pay

## 2018-08-21 ENCOUNTER — Ambulatory Visit (INDEPENDENT_AMBULATORY_CARE_PROVIDER_SITE_OTHER): Payer: Medicare Other | Admitting: Podiatry

## 2018-08-21 ENCOUNTER — Ambulatory Visit (INDEPENDENT_AMBULATORY_CARE_PROVIDER_SITE_OTHER): Payer: Medicare Other

## 2018-08-21 ENCOUNTER — Encounter: Payer: Self-pay | Admitting: Podiatry

## 2018-08-21 VITALS — Temp 97.7°F

## 2018-08-21 DIAGNOSIS — M778 Other enthesopathies, not elsewhere classified: Secondary | ICD-10-CM

## 2018-08-21 DIAGNOSIS — L84 Corns and callosities: Secondary | ICD-10-CM

## 2018-08-21 DIAGNOSIS — M779 Enthesopathy, unspecified: Secondary | ICD-10-CM | POA: Diagnosis not present

## 2018-08-21 DIAGNOSIS — E1151 Type 2 diabetes mellitus with diabetic peripheral angiopathy without gangrene: Secondary | ICD-10-CM

## 2018-08-26 NOTE — Progress Notes (Signed)
Subjective:   Patient ID: Angela Benitez, female   DOB: 85 y.o.   MRN: [id number]   HPI Patient presents with a lesion underneath the right foot that is been sore and making it hard to walk on.  Has a callus type tissue associated with this   ROS      Objective:  Physical Exam  Neurovascular status intact with inflammation pain around the fifth MPJ right that is sore when palpated and making walking difficult with fluid buildup under the joint     Assessment:  Inflammatory capsulitis fifth MPJ right     Plan:  Sterile prep and injected the capsule 3 mg dexamethasone Kenalog 5 mg Xylocaine did deep debridement of lesion advised on offloading the area and reappoint for [place] to recheck again in the next several months  X-rays dated today indicated there is quite a bit of forefoot arthritis bilateral but no indication of stress fracture or acute pathology of the bone

## 2018-09-15 DIAGNOSIS — H401131 Primary open-angle glaucoma, bilateral, mild stage: Secondary | ICD-10-CM | POA: Diagnosis not present

## 2018-09-16 ENCOUNTER — Other Ambulatory Visit: Payer: Self-pay

## 2018-09-16 ENCOUNTER — Ambulatory Visit (INDEPENDENT_AMBULATORY_CARE_PROVIDER_SITE_OTHER): Payer: Medicare Other

## 2018-09-16 DIAGNOSIS — I739 Peripheral vascular disease, unspecified: Secondary | ICD-10-CM

## 2018-09-28 NOTE — Progress Notes (Signed)
Lvm for pt to call back. 

## 2018-09-28 NOTE — Progress Notes (Signed)
S/w pt advisedf her to make appt with another provider. I transferred her to [facility] to make appt.

## 2018-10-06 ENCOUNTER — Other Ambulatory Visit: Payer: Self-pay

## 2018-10-06 ENCOUNTER — Encounter: Payer: Self-pay | Admitting: Cardiology

## 2018-10-06 ENCOUNTER — Ambulatory Visit (INDEPENDENT_AMBULATORY_CARE_PROVIDER_SITE_OTHER): Payer: Medicare Other | Admitting: Cardiology

## 2018-10-06 VITALS — BP 147/71 | HR 70 | Temp 95.1°F | Ht 63.0 in | Wt 132.0 lb

## 2018-10-06 DIAGNOSIS — I739 Peripheral vascular disease, unspecified: Secondary | ICD-10-CM

## 2018-10-06 DIAGNOSIS — I119 Hypertensive heart disease without heart failure: Secondary | ICD-10-CM

## 2018-10-06 DIAGNOSIS — E7800 Pure hypercholesterolemia, unspecified: Secondary | ICD-10-CM

## 2018-10-06 NOTE — Progress Notes (Signed)
Primary Physician:  Clayborn Heron, MD   Patient ID: Angela Benitez, female    DOB: 1933-07-11, 85 y.o.   MRN: [id number]  Subjective:    Chief Complaint  Patient presents with  . PAD    follow up     HPI: Angela Benitez  is a 85 y.o. female  with hypertension, diabetes mellitus type 2 and hypercholesterolemia here for follow up on claudication and to discuss recent lower extremity duplex results.   She denies any complaints of chest pain, chest tightness, shortness of breath, orthopnea or PND. No complaints of palpitation, dizziness, near-syncope or syncope. No history of swelling on the legs.   Patient reports bilateral leg tightness with walking, left worse than the right, that is now limiting her activities.  She previously walked in the park, but due to her leg pain she has stopped doing this.  She denies any ulcerations or skin color changes.  No rest pain.  Past Medical History:  Diagnosis Date  . Diabetes mellitus without complication (HCC)   . Hyperlipidemia   . Hypertension   . Mild mitral regurgitation   . Murmur   . Neuropathy   . PAD (peripheral artery disease) (HCC)     History reviewed. No pertinent surgical history.  Social History   Socioeconomic History  . Marital status: Widowed    Spouse name: Not on file  . Number of children: 3  . Years of education: Not on file  . Highest education level: Not on file  Occupational History  . Not on file  Social Needs  . Financial resource strain: Not on file  . Food insecurity    Worry: Not on file    Inability: Not on file  . Transportation needs    Medical: Not on file    Non-medical: Not on file  Tobacco Use  . Smoking status: Never Smoker  . Smokeless tobacco: Never Used  Substance and Sexual Activity  . Alcohol use: No  . Drug use: Not on file  . Sexual activity: Not on file  Lifestyle  . Physical activity    Days per week: Not on file    Minutes per session: Not on file  . Stress: Not  on file  Relationships  . Social Musician on phone: Not on file    Gets together: Not on file    Attends religious service: Not on file    Active member of club or organization: Not on file    Attends meetings of clubs or organizations: Not on file    Relationship status: Not on file  . Intimate partner violence    Fear of current or ex partner: Not on file    Emotionally abused: Not on file    Physically abused: Not on file    Forced sexual activity: Not on file  Other Topics Concern  . Not on file  Social History Narrative  . Not on file    Review of Systems  Constitution: Negative for decreased appetite, malaise/fatigue, weight gain and weight loss.  Eyes: Negative for visual disturbance.  Cardiovascular: Positive for claudication. Negative for chest pain, dyspnea on exertion, leg swelling, orthopnea, palpitations and syncope.  Respiratory: Negative for hemoptysis and wheezing.   Endocrine: Negative for cold intolerance and heat intolerance.  Hematologic/Lymphatic: Does not bruise/bleed easily.  Skin: Negative for nail changes.  Musculoskeletal: Negative for muscle weakness and myalgias.  Gastrointestinal: Negative for abdominal pain, change in bowel habit,  nausea and vomiting.  Neurological: Negative for difficulty with concentration, dizziness, focal weakness and headaches.  Psychiatric/Behavioral: Negative for altered mental status and suicidal ideas.  All other systems reviewed and are negative.     Objective:  Blood pressure (!) 147/71, pulse 70, temperature (!) 95.1 F (35.1 C), height 5\' 3"  (1.6 m), weight 132 lb (59.9 kg), SpO2 98 %. Body mass index is 23.38 kg/m.    Physical Exam  Constitutional: She is oriented to person, place, and time. Vital signs are normal. She appears well-developed and well-nourished.  HENT:  Head: Normocephalic and atraumatic.  Neck: Normal range of motion.  Cardiovascular: Normal rate, regular rhythm and intact distal  pulses.  Murmur heard.  Early systolic murmur is present with a grade of 1/6 at the upper right sternal border. Pulses:      Femoral pulses are 1+ on the right side and 1+ on the left side.      Popliteal pulses are 0 on the right side and 0 on the left side.       Dorsalis pedis pulses are 0 on the right side and 0 on the left side.       Posterior tibial pulses are 0 on the right side and 0 on the left side.  Pulmonary/Chest: Effort normal and breath sounds normal. No accessory muscle usage. No respiratory distress.  Abdominal: Soft. Bowel sounds are normal.  Musculoskeletal: Normal range of motion.  Neurological: She is alert and oriented to person, place, and time.  Skin: Skin is warm and dry.  Vitals reviewed.  Radiology: No results found.  Laboratory examination:    No flowsheet data found. No flowsheet data found. Lipid Panel  No results found for: CHOL, TRIG, HDL, CHOLHDL, VLDL, LDLCALC, LDLDIRECT HEMOGLOBIN A1C No results found for: HGBA1C, MPG TSH No results for input(s): TSH in the last 8760 hours.  PRN Meds:. There are no discontinued medications. Current Meds  Medication Sig  . aspirin EC 81 MG tablet Take 81 mg by mouth daily.  . bimatoprost (LUMIGAN) 0.01 % SOLN daily. Each eye  . fosinopril (MONOPRIL) 40 MG tablet fosinopril 40 mg tablet  . gabapentin (NEURONTIN) 100 MG capsule Take 100 mg by mouth. 2 @ bedtime  . glimepiride (AMARYL) 2 MG tablet 1.5 at breakfast, 1 @ bedtime  . hydrochlorothiazide (HYDRODIURIL) 25 MG tablet hydrochlorothiazide 25 mg tablet  . latanoprost (XALATAN) 0.005 % ophthalmic solution INSTILL 1 DROP INTO EACH EYE AT BEDTIME  . metFORMIN (GLUCOPHAGE) 1000 MG tablet Take 1,000 mg by mouth 2 (two) times a day.   . metoprolol tartrate (LOPRESSOR) 50 MG tablet Take 50 mg by mouth daily.   . pravastatin (PRAVACHOL) 20 MG tablet Take 20 mg by mouth daily.     Cardiac Studies:   Lower Extremity Arterial Duplex 09/16/2018: There is  diffuse mixed plaque in bilateral lower extremity. Bilateral distal SFA/popliteal artery reveals monophasic waveforms bilaterally, suggest hemodynamically significant stenosis in the proximal vessel (proximal and mid SFA). Critical decrease in bilateral ABI with monophasic waveforms at the level of the ankle.  Right ABI 0.36, left ABI 0.46.  Consider further evaluation for critical limb ischemia if clinically indicated. Compared to the study done on 09/16/1816, right ABI 0.40, left ABI 0.60, overall no significant change in Doppler signal, study may suggest mild progression.  Echocardiogram 05/02/2016: Left ventricle cavity is small. Cavitary obliteration noted and suspect intraventricular PG. Mild concentric remodeling of the left ventricle. Basal septal hypertrophy. Normal global wall motion. Doppler evidence  of grade II (pseudonormal) diastolic dysfunction, elevated LAP. Calculated EF 74%. Mild (Grade I) mitral regurgitation. No evidence of mitral valve stenosis. Moderate tricuspid regurgitation. Mild pulmonary hypertension. Pulmonary artery systolic pressure is estimated at 33 mm Hg. Mild pulmonic regurgitation.  Assessment:   PAD (peripheral artery disease) (HCC)  Hypertension with heart disease  Hypercholesterolemia  EKG- Sinus rhythm, within normal limits.  Recommendations:   Patient is here for follow-up to discuss lower extremity arterial duplex results.  She has over the last few months had worsening symptoms of claudication that are affecting her activity.  I have discussed lower extremity duplex results, compared to her previous study in 2018 she has had mild progression of disease.  She does have significantly abnormal ABI with right being 0.36 and left 0.46.  She does not have any rest pain or evidence of critical limb ischemia.  Given her significant symptoms and abnormalities on lower extremity duplex, I have recommended proceeding with PV angiogram.  This was discussed with the  patient as well as her daughter-in-law.  Patient is in agreements with this.  Will schedule for peripheral arteriogram and possible angioplasty given symptoms. Patient understands the risks, benefits, alternatives including medical therapy, CT angiography. Patient understands <1-2% risk of death, embolic complications, bleeding, infection, renal failure, urgent surgical revascularization, but not limited to these and wants to proceed. She is on aspirin and statin therapy.  Blood pressure is well controlled.  I will plan to see her back after the procedure for follow-up.   *I have discussed this case with Dr. Jacinto HalimGanji and he participated in formulating the plan.*    Toniann FailAshton Haynes Kelley, MSN, APRN, FNP-C [facility] [place] Office: [phone] Fax: [phone]

## 2018-10-07 ENCOUNTER — Encounter: Payer: Self-pay | Admitting: Cardiology

## 2018-10-14 DIAGNOSIS — Z23 Encounter for immunization: Secondary | ICD-10-CM | POA: Diagnosis not present

## 2018-10-16 ENCOUNTER — Other Ambulatory Visit: Payer: Self-pay | Admitting: Cardiology

## 2018-10-16 DIAGNOSIS — I119 Hypertensive heart disease without heart failure: Secondary | ICD-10-CM | POA: Diagnosis not present

## 2018-10-16 DIAGNOSIS — I739 Peripheral vascular disease, unspecified: Secondary | ICD-10-CM

## 2018-10-16 LAB — CBC
Hematocrit: 35.7 % (ref 34.0–46.6)
Hemoglobin: 12.1 g/dL (ref 11.1–15.9)
MCH: 30.8 pg (ref 26.6–33.0)
MCHC: 33.9 g/dL (ref 31.5–35.7)
MCV: 91 fL (ref 79–97)
Platelets: 210 10*3/uL (ref 150–450)
RBC: 3.93 x10E6/uL (ref 3.77–5.28)
RDW: 12.6 % (ref 11.7–15.4)
WBC: 9.1 10*3/uL (ref 3.4–10.8)

## 2018-10-16 NOTE — H&P (View-Only) (Signed)
Bmp and cbc order placed

## 2018-10-16 NOTE — Progress Notes (Signed)
Bmp and cbc order placed 

## 2018-10-17 LAB — BASIC METABOLIC PANEL
BUN/Creatinine Ratio: 17 (ref 12–28)
BUN: 18 mg/dL (ref 8–27)
CO2: 23 mmol/L (ref 20–29)
Calcium: 9.4 mg/dL (ref 8.7–10.3)
Chloride: 108 mmol/L — ABNORMAL HIGH (ref 96–106)
Creatinine, Ser: 1.08 mg/dL — ABNORMAL HIGH (ref 0.57–1.00)
GFR calc Af Amer: 55 mL/min/{1.73_m2} — ABNORMAL LOW (ref >59)
GFR calc non Af Amer: 48 mL/min/{1.73_m2} — ABNORMAL LOW (ref >59)
Glucose: 99 mg/dL (ref 65–99)
Potassium: 4.7 mmol/L (ref 3.5–5.2)
Sodium: 143 mmol/L (ref 134–144)

## 2018-10-23 ENCOUNTER — Other Ambulatory Visit (HOSPITAL_COMMUNITY)
Admission: RE | Admit: 2018-10-23 | Discharge: 2018-10-23 | Disposition: A | Discharge status: Home / Self Care | Payer: Medicare Other | Source: Ambulatory Visit | Attending: Cardiology | Admitting: Cardiology

## 2018-10-23 DIAGNOSIS — Z01812 Encounter for preprocedural laboratory examination: Secondary | ICD-10-CM | POA: Diagnosis not present

## 2018-10-23 DIAGNOSIS — Z20828 Contact with and (suspected) exposure to other viral communicable diseases: Secondary | ICD-10-CM | POA: Diagnosis not present

## 2018-10-24 LAB — NOVEL CORONAVIRUS, NAA (HOSP ORDER, SEND-OUT TO REF LAB; TAT 18-24 HRS): SARS-CoV-2, NAA: NOT DETECTED

## 2018-10-27 ENCOUNTER — Other Ambulatory Visit: Payer: Self-pay

## 2018-10-27 ENCOUNTER — Ambulatory Visit (HOSPITAL_COMMUNITY)
Admission: RE | Admit: 2018-10-27 | Discharge: 2018-10-27 | Disposition: A | Discharge status: Home / Self Care | Payer: Medicare Other | Attending: Cardiology | Admitting: Cardiology

## 2018-10-27 ENCOUNTER — Encounter (HOSPITAL_COMMUNITY)
Admission: RE | Disposition: A | Discharge status: Home / Self Care | Payer: Self-pay | Source: Home / Self Care | Attending: Cardiology

## 2018-10-27 DIAGNOSIS — I119 Hypertensive heart disease without heart failure: Secondary | ICD-10-CM | POA: Diagnosis not present

## 2018-10-27 DIAGNOSIS — Z7984 Long term (current) use of oral hypoglycemic drugs: Secondary | ICD-10-CM | POA: Diagnosis not present

## 2018-10-27 DIAGNOSIS — E7800 Pure hypercholesterolemia, unspecified: Secondary | ICD-10-CM | POA: Diagnosis not present

## 2018-10-27 DIAGNOSIS — E1140 Type 2 diabetes mellitus with diabetic neuropathy, unspecified: Secondary | ICD-10-CM | POA: Diagnosis not present

## 2018-10-27 DIAGNOSIS — I739 Peripheral vascular disease, unspecified: Secondary | ICD-10-CM

## 2018-10-27 DIAGNOSIS — Z7982 Long term (current) use of aspirin: Secondary | ICD-10-CM | POA: Insufficient documentation

## 2018-10-27 DIAGNOSIS — E785 Hyperlipidemia, unspecified: Secondary | ICD-10-CM | POA: Insufficient documentation

## 2018-10-27 DIAGNOSIS — Z79899 Other long term (current) drug therapy: Secondary | ICD-10-CM | POA: Diagnosis not present

## 2018-10-27 DIAGNOSIS — I70211 Atherosclerosis of native arteries of extremities with intermittent claudication, right leg: Secondary | ICD-10-CM | POA: Diagnosis not present

## 2018-10-27 HISTORY — PX: ABDOMINAL AORTOGRAM: CATH118222

## 2018-10-27 HISTORY — DX: Peripheral vascular disease, unspecified: I73.9

## 2018-10-27 HISTORY — PX: PERIPHERAL VASCULAR ATHERECTOMY: CATH118256

## 2018-10-27 HISTORY — PX: LOWER EXTREMITY ANGIOGRAPHY: CATH118251

## 2018-10-27 LAB — POCT ACTIVATED CLOTTING TIME
Activated Clotting Time: 191 seconds
Activated Clotting Time: 230 seconds
Activated Clotting Time: 285 seconds

## 2018-10-27 LAB — GLUCOSE, CAPILLARY: Glucose-Capillary: 94 mg/dL (ref 70–99)

## 2018-10-27 SURGERY — LOWER EXTREMITY ANGIOGRAPHY
Anesthesia: LOCAL

## 2018-10-27 MED ORDER — SODIUM CHLORIDE 0.9% FLUSH: 3.0000 mL | INTRAVENOUS | Status: DC | PRN

## 2018-10-27 MED ORDER — NITROGLYCERIN 1 MG/10 ML FOR IR/CATH LAB
INTRA_ARTERIAL | Status: AC
  Filled 2018-10-27: qty 10

## 2018-10-27 MED ORDER — SODIUM CHLORIDE 0.9% FLUSH: 3.0000 mL | Freq: Two times a day (BID) | INTRAVENOUS | Status: DC

## 2018-10-27 MED ORDER — HYDRALAZINE HCL 20 MG/ML IJ SOLN
INTRAMUSCULAR | Status: DC | PRN
  Administered 2018-10-27: 10 mg via INTRAVENOUS
  Administered 2018-10-27: 5 mg via INTRAVENOUS

## 2018-10-27 MED ORDER — LIDOCAINE HCL (PF) 1 % IJ SOLN
INTRAMUSCULAR | Status: AC
  Filled 2018-10-27: qty 30

## 2018-10-27 MED ORDER — HYDRALAZINE HCL 20 MG/ML IJ SOLN: 5.0000 mg | INTRAMUSCULAR | Status: DC | PRN

## 2018-10-27 MED ORDER — HEPARIN (PORCINE) IN NACL 1000-0.9 UT/500ML-% IV SOLN
INTRAVENOUS | Status: AC
  Filled 2018-10-27: qty 1000

## 2018-10-27 MED ORDER — MIDAZOLAM HCL 2 MG/2ML IJ SOLN
INTRAMUSCULAR | Status: AC
  Filled 2018-10-27: qty 2

## 2018-10-27 MED ORDER — LIDOCAINE HCL (PF) 1 % IJ SOLN
INTRAMUSCULAR | Status: DC | PRN
  Administered 2018-10-27: 15 mL

## 2018-10-27 MED ORDER — HEPARIN SODIUM (PORCINE) 1000 UNIT/ML IJ SOLN
INTRAMUSCULAR | Status: DC | PRN
  Administered 2018-10-27: 3000 [IU] via INTRAVENOUS
  Administered 2018-10-27: 7000 [IU] via INTRAVENOUS
  Administered 2018-10-27: 3000 [IU] via INTRAVENOUS

## 2018-10-27 MED ORDER — ONDANSETRON HCL 4 MG/2ML IJ SOLN: 4.0000 mg | Freq: Four times a day (QID) | INTRAMUSCULAR | Status: DC | PRN

## 2018-10-27 MED ORDER — SODIUM CHLORIDE 0.9 % IV SOLN: 250.0000 mL | INTRAVENOUS | Status: DC | PRN

## 2018-10-27 MED ORDER — HEPARIN (PORCINE) IN NACL 1000-0.9 UT/500ML-% IV SOLN
INTRAVENOUS | Status: DC | PRN
  Administered 2018-10-27 (×2): 500 mL

## 2018-10-27 MED ORDER — IODIXANOL 320 MG/ML IV SOLN
INTRAVENOUS | Status: DC | PRN
  Administered 2018-10-27: 135 mL

## 2018-10-27 MED ORDER — FENTANYL CITRATE (PF) 100 MCG/2ML IJ SOLN
INTRAMUSCULAR | Status: AC
  Filled 2018-10-27: qty 2

## 2018-10-27 MED ORDER — CLOPIDOGREL BISULFATE 75 MG PO TABS: 75.0000 mg | ORAL_TABLET | Freq: Every day | ORAL | Status: DC

## 2018-10-27 MED ORDER — HEPARIN SODIUM (PORCINE) 1000 UNIT/ML IJ SOLN
INTRAMUSCULAR | Status: AC
  Filled 2018-10-27: qty 1

## 2018-10-27 MED ORDER — HYDRALAZINE HCL 20 MG/ML IJ SOLN
INTRAMUSCULAR | Status: AC
  Filled 2018-10-27: qty 1

## 2018-10-27 MED ORDER — LABETALOL HCL 5 MG/ML IV SOLN: 10.0000 mg | INTRAVENOUS | Status: DC | PRN

## 2018-10-27 MED ORDER — CLOPIDOGREL BISULFATE 300 MG PO TABS
ORAL_TABLET | ORAL | Status: DC | PRN
  Administered 2018-10-27: 300 mg via ORAL

## 2018-10-27 MED ORDER — SODIUM CHLORIDE 0.9 % IV SOLN: INTRAVENOUS | Status: DC

## 2018-10-27 MED ORDER — ACETAMINOPHEN 325 MG PO TABS: 650.0000 mg | ORAL_TABLET | ORAL | Status: DC | PRN

## 2018-10-27 MED ORDER — FENTANYL CITRATE (PF) 100 MCG/2ML IJ SOLN
INTRAMUSCULAR | Status: DC | PRN
  Administered 2018-10-27: 50 ug via INTRAVENOUS

## 2018-10-27 MED ORDER — MIDAZOLAM HCL 2 MG/2ML IJ SOLN
INTRAMUSCULAR | Status: DC | PRN
  Administered 2018-10-27: 1 mg via INTRAVENOUS

## 2018-10-27 MED ORDER — SODIUM CHLORIDE 0.9 % IV BOLUS
500.0000 mL | Freq: Once | INTRAVENOUS | Status: AC
  Administered 2018-10-27: 500 mL via INTRAVENOUS

## 2018-10-27 MED ORDER — NITROGLYCERIN 1 MG/10 ML FOR IR/CATH LAB
INTRA_ARTERIAL | Status: DC | PRN
  Administered 2018-10-27: 500 ug
  Administered 2018-10-27: 400 ug

## 2018-10-27 MED ORDER — CLOPIDOGREL BISULFATE 75 MG PO TABS: 75.0000 mg | ORAL_TABLET | Freq: Every day | ORAL | 2 refills | Status: DC

## 2018-10-27 MED FILL — CLOPIDOGREL 75 MG TABLET: 75 | 90 days supply | Qty: 90 | Fill #0

## 2018-10-27 SURGICAL SUPPLY — 26 items
BALLN ADMIRAL INPACT 5X200 (BALLOONS) ×4
BALLN IN.PACT DCB 5X60 (BALLOONS) ×4
BALLOON ADMIRAL INPACT 5X200 (BALLOONS) IMPLANT
CATH HAWKONE LX EXTENDED TIP (CATHETERS) ×1 IMPLANT
CATH OMNI FLUSH 5F 65CM (CATHETERS) ×1 IMPLANT
CATH SOFT-VU ST 4F 90CM (CATHETERS) ×1 IMPLANT
CLOSURE MYNX CONTROL 6F/7F (Vascular Products) ×1 IMPLANT
DCB IN.PACT 5X60 (BALLOONS) IMPLANT
DEVICE SPIDERFX EMB PROT 6MM (WIRE) ×1 IMPLANT
GUIDEWIRE ANGLED .035X150CM (WIRE) ×1 IMPLANT
KIT ENCORE 26 ADVANTAGE (KITS) ×1 IMPLANT
KIT MICROPUNCTURE NIT STIFF (SHEATH) ×1 IMPLANT
KIT PV (KITS) ×4 IMPLANT
SHEATH FLEXOR ANSEL 1 7F 45CM (SHEATH) ×1 IMPLANT
SHEATH PINNACLE 5F 10CM (SHEATH) ×1 IMPLANT
SHEATH PINNACLE 7F 10CM (SHEATH) ×1 IMPLANT
SHEATH PROBE COVER 6X72 (BAG) ×1 IMPLANT
STOPCOCK MORSE 400PSI 3WAY (MISCELLANEOUS) ×1 IMPLANT
SYR MEDRAD MARK 7 150ML (SYRINGE) ×4 IMPLANT
TAPE VIPERTRACK RADIOPAQ (MISCELLANEOUS) IMPLANT
TAPE VIPERTRACK RADIOPAQUE (MISCELLANEOUS) ×8
TRANSDUCER W/STOPCOCK (MISCELLANEOUS) ×4 IMPLANT
TRAY PV CATH (CUSTOM PROCEDURE TRAY) ×4 IMPLANT
TUBING CIL FLEX 10 FLL-RA (TUBING) ×1 IMPLANT
WIRE HITORQ VERSACORE ST 145CM (WIRE) ×1 IMPLANT
WIRE SPARTACORE .014X300CM (WIRE) ×1 IMPLANT

## 2018-10-27 NOTE — Discharge Instructions (Signed)
Femoral Site Care °This sheet gives you information about how to care for yourself after your procedure. Your health care provider may also give you more specific instructions. If you have problems or questions, contact your health care provider. °What can I expect after the procedure? °After the procedure, it is common to have: °· Bruising that usually fades within 1-2 weeks. °· Tenderness at the site. °Follow these instructions at home: °Wound care °· Follow instructions from your health care provider about how to take care of your insertion site. Make sure you: °? Wash your hands with soap and water before you change your bandage (dressing). If soap and water are not available, use hand sanitizer. °? Change your dressing as told by your health care provider. °? Leave stitches (sutures), skin glue, or adhesive strips in place. These skin closures may need to stay in place for 2 weeks or longer. If adhesive strip edges start to loosen and curl up, you may trim the loose edges. Do not remove adhesive strips completely unless your health care provider tells you to do that. °· Do not take baths, swim, or use a hot tub until your health care provider approves. °· You may shower 24-48 hours after the procedure or as told by your health care provider. °? Gently wash the site with plain soap and water. °? Pat the area dry with a clean towel. °? Do not rub the site. This may cause bleeding. °· Do not apply powder or lotion to the site. Keep the site clean and dry. °· Check your femoral site every day for signs of infection. Check for: °? Redness, swelling, or pain. °? Fluid or blood. °? Warmth. °? Pus or a bad smell. °Activity °· For the first 2-3 days after your procedure, or as long as directed: °? Avoid climbing stairs as much as possible. °? Do not squat. °· Do not lift anything that is heavier than 10 lb (4.5 kg), or the limit that you are told, until your health care provider says that it is safe. °· Rest as  directed. °? Avoid sitting for a long time without moving. Get up to take short walks every 1-2 hours. °· Do not drive for 24 hours if you were given a medicine to help you relax (sedative). °General instructions °· Take over-the-counter and prescription medicines only as told by your health care provider. °· Keep all follow-up visits as told by your health care provider. This is important. °Contact a health care provider if you have: °· A fever or chills. °· You have redness, swelling, or pain around your insertion site. °Get help right away if: °· The catheter insertion area swells very fast. °· You pass out. °· You suddenly start to sweat or your skin gets clammy. °· The catheter insertion area is bleeding, and the bleeding does not stop when you hold steady pressure on the area. °· The area near or just beyond the catheter insertion site becomes pale, cool, tingly, or numb. °These symptoms may represent a serious problem that is an emergency. Do not wait to see if the symptoms will go away. Get medical help right away. Call your local emergency services (911 in the [place] Do not drive yourself to the hospital. °Summary °· After the procedure, it is common to have bruising that usually fades within 1-2 weeks. °· Check your femoral site every day for signs of infection. °· Do not lift anything that is heavier than 10 lb (4.5 kg), or the   limit that you are told, until your health care provider says that it is safe. °This information is not intended to replace advice given to you by your health care provider. Make sure you discuss any questions you have with your health care provider. °Document Released: 09/17/2013 Document Revised: 01/27/2017 Document Reviewed: 01/27/2017 °Elsevier Patient Education © 2020 [employer] ° °

## 2018-10-27 NOTE — Progress Notes (Signed)
Discharge instructions reviewed with pt and her daughter (via telephone) both voice understanding.  

## 2018-10-27 NOTE — Interval H&P Note (Signed)
History and Physical Interval Note:  10/27/2018 7:35 AM  Angela Benitez  has presented today for surgery, with the diagnosis of Claudication.  The various methods of treatment have been discussed with the patient and family. After consideration of risks, benefits and other options for treatment, the patient has consented to  Procedure(s): LOWER EXTREMITY ANGIOGRAPHY (Bilateral) as a surgical intervention.  The patient's history has been reviewed, patient examined, no change in status, stable for surgery.  I have reviewed the patient's chart and labs.  Questions were answered to the patient's satisfaction.     [place]

## 2018-10-28 ENCOUNTER — Encounter (HOSPITAL_COMMUNITY): Payer: Self-pay | Admitting: Cardiology

## 2018-11-03 ENCOUNTER — Encounter: Payer: Self-pay | Admitting: Cardiology

## 2018-11-03 ENCOUNTER — Telehealth: Payer: Self-pay

## 2018-11-03 ENCOUNTER — Other Ambulatory Visit: Payer: Self-pay

## 2018-11-03 ENCOUNTER — Ambulatory Visit (INDEPENDENT_AMBULATORY_CARE_PROVIDER_SITE_OTHER): Payer: Medicare Other | Admitting: Cardiology

## 2018-11-03 VITALS — BP 132/78 | HR 62 | Temp 96.4°F | Ht 63.0 in | Wt 135.0 lb

## 2018-11-03 DIAGNOSIS — E7800 Pure hypercholesterolemia, unspecified: Secondary | ICD-10-CM

## 2018-11-03 DIAGNOSIS — I119 Hypertensive heart disease without heart failure: Secondary | ICD-10-CM | POA: Diagnosis not present

## 2018-11-03 DIAGNOSIS — I739 Peripheral vascular disease, unspecified: Secondary | ICD-10-CM

## 2018-11-03 NOTE — H&P (View-Only) (Signed)
Primary Physician:  Aretta Nip, MD   Patient ID: Angela Benitez, female    DOB: 31-Aug-1933, 85 y.o.   MRN: [id number]  Subjective:    Chief Complaint  Patient presents with  . PAD    HPI: Angela Benitez  is a 85 y.o. female  with hypertension, diabetes mellitus type 2 and hypercholesterolemia, with worsening claudication and markedly abnormal LE arterial duplex, underwent complex PV angiogram on 10/27/18 with intervention to her left LE, here for post procedure follow up.   Doing well post procedure, no complaints. She has been doing short distance walking within her house every few hours.  She denies any complaints of chest pain, chest tightness, shortness of breath, orthopnea or PND. No complaints of palpitation, dizziness, near-syncope or syncope. No history of swelling on the legs. Reports her blood pressure has been well controlled.   She denies any ulcerations or skin color changes.  No rest pain.  Past Medical History:  Diagnosis Date  . Diabetes mellitus without complication ([place])   . Hyperlipidemia   . Hypertension   . Mild mitral regurgitation   . Murmur   . Neuropathy   . PAD (peripheral artery disease) ([place])     Past Surgical History:  Procedure Laterality Date  . ABDOMINAL AORTOGRAM N/A 10/27/2018   Procedure: ABDOMINAL AORTOGRAM;  Surgeon: Nigel Mormon, MD;  Location: [facility];  Service: Cardiovascular;  Laterality: N/A;  . LOWER EXTREMITY ANGIOGRAPHY Bilateral 10/27/2018   Procedure: LOWER EXTREMITY ANGIOGRAPHY;  Surgeon: Nigel Mormon, MD;  Location: [facility];  Service: Cardiovascular;  Laterality: Bilateral;  . PERIPHERAL VASCULAR ATHERECTOMY Left 10/27/2018   Procedure: PERIPHERAL VASCULAR ATHERECTOMY;  Surgeon: Nigel Mormon, MD;  Location: [facility];  Service: Cardiovascular;  Laterality: Left;  SFA with DRUG COATED BALLOON    Social History   Socioeconomic History  . Marital status: Widowed     Spouse name: Not on file  . Number of children: 3  . Years of education: Not on file  . Highest education level: Not on file  Occupational History  . Not on file  Social Needs  . Financial resource strain: Not on file  . Food insecurity    Worry: Not on file    Inability: Not on file  . Transportation needs    Medical: Not on file    Non-medical: Not on file  Tobacco Use  . Smoking status: Never Smoker  . Smokeless tobacco: Never Used  Substance and Sexual Activity  . Alcohol use: No  . Drug use: Not on file  . Sexual activity: Not on file  Lifestyle  . Physical activity    Days per week: Not on file    Minutes per session: Not on file  . Stress: Not on file  Relationships  . Social Herbalist on phone: Not on file    Gets together: Not on file    Attends religious service: Not on file    Active member of club or organization: Not on file    Attends meetings of clubs or organizations: Not on file    Relationship status: Not on file  . Intimate partner violence    Fear of current or ex partner: Not on file    Emotionally abused: Not on file    Physically abused: Not on file    Forced sexual activity: Not on file  Other Topics Concern  . Not on file  Social History Narrative  . Not on file    Review of Systems  Constitution: Negative for decreased appetite, malaise/fatigue, weight gain and weight loss.  Eyes: Negative for visual disturbance.  Cardiovascular: Positive for claudication. Negative for chest pain, dyspnea on exertion, leg swelling, orthopnea, palpitations and syncope.  Respiratory: Negative for hemoptysis and wheezing.   Endocrine: Negative for cold intolerance and heat intolerance.  Hematologic/Lymphatic: Does not bruise/bleed easily.  Skin: Negative for nail changes.  Musculoskeletal: Negative for muscle weakness and myalgias.  Gastrointestinal: Negative for abdominal pain, change in bowel habit, nausea and vomiting.  Neurological:  Negative for difficulty with concentration, dizziness, focal weakness and headaches.  Psychiatric/Behavioral: Negative for altered mental status and suicidal ideas.  All other systems reviewed and are negative.     Objective:  Blood pressure (!) 164/58, pulse 62, temperature (!) 96.4 F (35.8 C), height 5\' 3"  (1.6 m), weight 135 lb (61.2 kg), SpO2 99 %. Body mass index is 23.91 kg/m.    Physical Exam  Constitutional: She is oriented to person, place, and time. Vital signs are normal. She appears well-developed and well-nourished.  HENT:  Head: Normocephalic and atraumatic.  Neck: Normal range of motion.  Cardiovascular: Normal rate, regular rhythm and intact distal pulses.  Murmur heard.  Early systolic murmur is present with a grade of 1/6 at the upper right sternal border. Pulses:      Femoral pulses are 1+ on the right side and 2+ on the left side.      Popliteal pulses are 0 on the right side and 1+ on the left side.       Dorsalis pedis pulses are 0 on the right side and 0 on the left side.       Posterior tibial pulses are 0 on the right side and 0 on the left side.  Pulmonary/Chest: Effort normal and breath sounds normal. No accessory muscle usage. No respiratory distress.  Abdominal: Soft. Bowel sounds are normal.  Musculoskeletal: Normal range of motion.  Neurological: She is alert and oriented to person, place, and time.  Skin: Skin is warm and dry.  Vitals reviewed.  Radiology: No results found.  Laboratory examination:    CMP Latest Ref Rng & Units 10/16/2018  Glucose 65 - 99 mg/dL 99  BUN 8 - 27 mg/dL 18  Creatinine 1.610.57 - 0.961.00 mg/dL 0.45(W1.08(H)  Sodium 098134 - 119144 mmol/L 143  Potassium 3.5 - 5.2 mmol/L 4.7  Chloride 96 - 106 mmol/L 108(H)  CO2 20 - 29 mmol/L 23  Calcium 8.7 - 10.3 mg/dL 9.4   CBC Latest Ref Rng & Units 10/16/2018  WBC 3.4 - 10.8 x10E3/uL 9.1  Hemoglobin 11.1 - 15.9 g/dL 14.712.1  Hematocrit 82.934.0 - 46.6 % 35.7  Platelets 150 - 450 x10E3/uL 210    Lipid Panel  No results found for: CHOL, TRIG, HDL, CHOLHDL, VLDL, LDLCALC, LDLDIRECT HEMOGLOBIN A1C No results found for: HGBA1C, MPG TSH No results for input(s): TSH in the last 8760 hours.  PRN Meds:. There are no discontinued medications. Current Meds  Medication Sig  . aspirin EC 81 MG tablet Take 81 mg by mouth daily.  . clopidogrel (PLAVIX) 75 MG tablet Take 1 tablet (75 mg total) by mouth daily.  . fosinopril (MONOPRIL) 40 MG tablet Take 40 mg by mouth daily.   Marland Kitchen. glimepiride (AMARYL) 2 MG tablet Take 2-3 mg by mouth See admin instructions. 1.5  (3 mg) at breakfast, 1 (2mg ) @ bedtime  . latanoprost (XALATAN) 0.005 %  ophthalmic solution Place 1 drop into both eyes at bedtime.   . metFORMIN (GLUCOPHAGE) 1000 MG tablet Take 1,000 mg by mouth 2 (two) times a day.   . metoprolol tartrate (LOPRESSOR) 50 MG tablet Take 50 mg by mouth daily.   . pravastatin (PRAVACHOL) 20 MG tablet Take 20 mg by mouth daily.     Cardiac Studies:   Abdominal aortagram and PV angiogram 10/27/18:  Abdominal aorta: Mild calcific disease Left renal artery proximal 60% stenosis Left common iliac artery: Focal 30% stenosis Left common iliac artery: Minimal disease  Left SFA: Proximal 95% focal stenosis, long diffuse 60% stenosis left mid to distal SFA, followed by focal 95% distal SFA stenosis 10)% and 40% P1/P2 poppliteal disease. Occluded Left PT trunk, Lt PT and peroneal. Patent Left ATA with 100% distal occlusion at ankle level.Minimal collaterals from prox PT trunk and left ATA with no significant vessel reconstitution and named vessel runoff at ankle level.   Right SFA: Tandem 95% stenoses right mid SFA. 100% occluded Rt poppliteal atery. Minimal collaterals from distal SFA, that reconsitute Rt AT just above ankle level for one vessel runoff.   Directional atherectomy and PTA with drug coated balloons, using distal embolization protection     5.0 X 200 mm In.Pact DCB left distal superficial  femoral artery/proximal left poppliteal artery     5.0 X 60 mm In.Pact DCB left proximal superficial femoral artery     Excellent results with 0% residual stenosis in left SFA and left popliteal artery. Intact distal vessels without distal embolization.  Lower Extremity Arterial Duplex 09/16/2018: There is diffuse mixed plaque in bilateral lower extremity. Bilateral distal SFA/popliteal artery reveals monophasic waveforms bilaterally, suggest hemodynamically significant stenosis in the proximal vessel (proximal and mid SFA). Critical decrease in bilateral ABI with monophasic waveforms at the level of the ankle.  Right ABI 0.36, left ABI 0.46.  Consider further evaluation for critical limb ischemia if clinically indicated. Compared to the study done on 09/16/1816, right ABI 0.40, left ABI 0.60, overall no significant change in Doppler signal, study may suggest mild progression.  Echocardiogram 05/02/2016: Left ventricle cavity is small. Cavitary obliteration noted and suspect intraventricular PG. Mild concentric remodeling of the left ventricle. Basal septal hypertrophy. Normal global wall motion. Doppler evidence of grade II (pseudonormal) diastolic dysfunction, elevated LAP. Calculated EF 74%. Mild (Grade I) mitral regurgitation. No evidence of mitral valve stenosis. Moderate tricuspid regurgitation. Mild pulmonary hypertension. Pulmonary artery systolic pressure is estimated at 33 mm Hg. Mild pulmonic regurgitation.  Assessment:   PAD (peripheral artery disease) ([facility]) - Plan: CANCELED: EKG 12-Lead  Hypertension with heart disease - Plan: CANCELED: EKG 12-Lead  Hypercholesterolemia  EKG- Sinus rhythm, within normal limits.  Recommendations:   Patient is doing well post procedure.  Patient underwent complex PV angiogram with arthrectomy and left distal stent and left proximal stenting.  She is recovered from her procedure without any complications.  No hematoma is noted.  She does have  critical limb ischemia to her right lower extremity and will need repeat PV angiogram for intervention for the same.  I will arrange this in the next few weeks.  Will schedule for peripheral arteriogram and possible angioplasty given symptoms. Patient understands the risks, benefits, alternatives including medical therapy, CT angiography. Patient understands <1-2% risk of death, embolic complications, bleeding, infection, renal failure, urgent surgical revascularization, but not limited to these and wants to proceed. She will continue with aspirin and Plavix at least for 1 year post intervention.  I  have encouraged her to try to do some walking at least short distances at home to prevent any deconditioning.  Her blood pressure was initially elevated in our office, but on recheck had significantly improved.  Continue with present medications.  She is on pravastatin for management of hyperlipidemia, I am unsure of her recent lipids and will request this from her PCP office.  I will see her back in the next few weeks after repeat PV angiogram.  Plan of care was discussed with her daughter-in-law who is in agreements with the plan.    Toniann Fail, MSN, APRN, FNP-C [facility] Office: [phone] Fax: [phone]

## 2018-11-03 NOTE — Progress Notes (Signed)
Primary Physician:  Aretta Nip, MD   Patient ID: Angela Benitez, female    DOB: 31-Aug-1933, 85 y.o.   MRN: [id number]  Subjective:    Chief Complaint  Patient presents with  . PAD    HPI: Angela Benitez  is a 85 y.o. female  with hypertension, diabetes mellitus type 2 and hypercholesterolemia, with worsening claudication and markedly abnormal LE arterial duplex, underwent complex PV angiogram on 10/27/18 with intervention to her left LE, here for post procedure follow up.   Doing well post procedure, no complaints. She has been doing short distance walking within her house every few hours.  She denies any complaints of chest pain, chest tightness, shortness of breath, orthopnea or PND. No complaints of palpitation, dizziness, near-syncope or syncope. No history of swelling on the legs. Reports her blood pressure has been well controlled.   She denies any ulcerations or skin color changes.  No rest pain.  Past Medical History:  Diagnosis Date  . Diabetes mellitus without complication ([place])   . Hyperlipidemia   . Hypertension   . Mild mitral regurgitation   . Murmur   . Neuropathy   . PAD (peripheral artery disease) ([place])     Past Surgical History:  Procedure Laterality Date  . ABDOMINAL AORTOGRAM N/A 10/27/2018   Procedure: ABDOMINAL AORTOGRAM;  Surgeon: Nigel Mormon, MD;  Location: [facility];  Service: Cardiovascular;  Laterality: N/A;  . LOWER EXTREMITY ANGIOGRAPHY Bilateral 10/27/2018   Procedure: LOWER EXTREMITY ANGIOGRAPHY;  Surgeon: Nigel Mormon, MD;  Location: [facility];  Service: Cardiovascular;  Laterality: Bilateral;  . PERIPHERAL VASCULAR ATHERECTOMY Left 10/27/2018   Procedure: PERIPHERAL VASCULAR ATHERECTOMY;  Surgeon: Nigel Mormon, MD;  Location: [facility];  Service: Cardiovascular;  Laterality: Left;  SFA with DRUG COATED BALLOON    Social History   Socioeconomic History  . Marital status: Widowed     Spouse name: Not on file  . Number of children: 3  . Years of education: Not on file  . Highest education level: Not on file  Occupational History  . Not on file  Social Needs  . Financial resource strain: Not on file  . Food insecurity    Worry: Not on file    Inability: Not on file  . Transportation needs    Medical: Not on file    Non-medical: Not on file  Tobacco Use  . Smoking status: Never Smoker  . Smokeless tobacco: Never Used  Substance and Sexual Activity  . Alcohol use: No  . Drug use: Not on file  . Sexual activity: Not on file  Lifestyle  . Physical activity    Days per week: Not on file    Minutes per session: Not on file  . Stress: Not on file  Relationships  . Social Herbalist on phone: Not on file    Gets together: Not on file    Attends religious service: Not on file    Active member of club or organization: Not on file    Attends meetings of clubs or organizations: Not on file    Relationship status: Not on file  . Intimate partner violence    Fear of current or ex partner: Not on file    Emotionally abused: Not on file    Physically abused: Not on file    Forced sexual activity: Not on file  Other Topics Concern  . Not on file  Social History Narrative  . Not on file    Review of Systems  Constitution: Negative for decreased appetite, malaise/fatigue, weight gain and weight loss.  Eyes: Negative for visual disturbance.  Cardiovascular: Positive for claudication. Negative for chest pain, dyspnea on exertion, leg swelling, orthopnea, palpitations and syncope.  Respiratory: Negative for hemoptysis and wheezing.   Endocrine: Negative for cold intolerance and heat intolerance.  Hematologic/Lymphatic: Does not bruise/bleed easily.  Skin: Negative for nail changes.  Musculoskeletal: Negative for muscle weakness and myalgias.  Gastrointestinal: Negative for abdominal pain, change in bowel habit, nausea and vomiting.  Neurological:  Negative for difficulty with concentration, dizziness, focal weakness and headaches.  Psychiatric/Behavioral: Negative for altered mental status and suicidal ideas.  All other systems reviewed and are negative.     Objective:  Blood pressure (!) 164/58, pulse 62, temperature (!) 96.4 F (35.8 C), height 5\' 3"  (1.6 m), weight 135 lb (61.2 kg), SpO2 99 %. Body mass index is 23.91 kg/m.    Physical Exam  Constitutional: She is oriented to person, place, and time. Vital signs are normal. She appears well-developed and well-nourished.  HENT:  Head: Normocephalic and atraumatic.  Neck: Normal range of motion.  Cardiovascular: Normal rate, regular rhythm and intact distal pulses.  Murmur heard.  Early systolic murmur is present with a grade of 1/6 at the upper right sternal border. Pulses:      Femoral pulses are 1+ on the right side and 2+ on the left side.      Popliteal pulses are 0 on the right side and 1+ on the left side.       Dorsalis pedis pulses are 0 on the right side and 0 on the left side.       Posterior tibial pulses are 0 on the right side and 0 on the left side.  Pulmonary/Chest: Effort normal and breath sounds normal. No accessory muscle usage. No respiratory distress.  Abdominal: Soft. Bowel sounds are normal.  Musculoskeletal: Normal range of motion.  Neurological: She is alert and oriented to person, place, and time.  Skin: Skin is warm and dry.  Vitals reviewed.  Radiology: No results found.  Laboratory examination:    CMP Latest Ref Rng & Units 10/16/2018  Glucose 65 - 99 mg/dL 99  BUN 8 - 27 mg/dL 18  Creatinine 1.610.57 - 0.961.00 mg/dL 0.45(W1.08(H)  Sodium 098134 - 119144 mmol/L 143  Potassium 3.5 - 5.2 mmol/L 4.7  Chloride 96 - 106 mmol/L 108(H)  CO2 20 - 29 mmol/L 23  Calcium 8.7 - 10.3 mg/dL 9.4   CBC Latest Ref Rng & Units 10/16/2018  WBC 3.4 - 10.8 x10E3/uL 9.1  Hemoglobin 11.1 - 15.9 g/dL 14.712.1  Hematocrit 82.934.0 - 46.6 % 35.7  Platelets 150 - 450 x10E3/uL 210    Lipid Panel  No results found for: CHOL, TRIG, HDL, CHOLHDL, VLDL, LDLCALC, LDLDIRECT HEMOGLOBIN A1C No results found for: HGBA1C, MPG TSH No results for input(s): TSH in the last 8760 hours.  PRN Meds:. There are no discontinued medications. Current Meds  Medication Sig  . aspirin EC 81 MG tablet Take 81 mg by mouth daily.  . clopidogrel (PLAVIX) 75 MG tablet Take 1 tablet (75 mg total) by mouth daily.  . fosinopril (MONOPRIL) 40 MG tablet Take 40 mg by mouth daily.   Marland Kitchen. glimepiride (AMARYL) 2 MG tablet Take 2-3 mg by mouth See admin instructions. 1.5  (3 mg) at breakfast, 1 (2mg ) @ bedtime  . latanoprost (XALATAN) 0.005 %  ophthalmic solution Place 1 drop into both eyes at bedtime.   . metFORMIN (GLUCOPHAGE) 1000 MG tablet Take 1,000 mg by mouth 2 (two) times a day.   . metoprolol tartrate (LOPRESSOR) 50 MG tablet Take 50 mg by mouth daily.   . pravastatin (PRAVACHOL) 20 MG tablet Take 20 mg by mouth daily.     Cardiac Studies:   Abdominal aortagram and PV angiogram 10/27/18:  Abdominal aorta: Mild calcific disease Left renal artery proximal 60% stenosis Left common iliac artery: Focal 30% stenosis Left common iliac artery: Minimal disease  Left SFA: Proximal 95% focal stenosis, long diffuse 60% stenosis left mid to distal SFA, followed by focal 95% distal SFA stenosis 10)% and 40% P1/P2 poppliteal disease. Occluded Left PT trunk, Lt PT and peroneal. Patent Left ATA with 100% distal occlusion at ankle level.Minimal collaterals from prox PT trunk and left ATA with no significant vessel reconstitution and named vessel runoff at ankle level.   Right SFA: Tandem 95% stenoses right mid SFA. 100% occluded Rt poppliteal atery. Minimal collaterals from distal SFA, that reconsitute Rt AT just above ankle level for one vessel runoff.   Directional atherectomy and PTA with drug coated balloons, using distal embolization protection     5.0 X 200 mm In.Pact DCB left distal superficial  femoral artery/proximal left poppliteal artery     5.0 X 60 mm In.Pact DCB left proximal superficial femoral artery     Excellent results with 0% residual stenosis in left SFA and left popliteal artery. Intact distal vessels without distal embolization.  Lower Extremity Arterial Duplex 09/16/2018: There is diffuse mixed plaque in bilateral lower extremity. Bilateral distal SFA/popliteal artery reveals monophasic waveforms bilaterally, suggest hemodynamically significant stenosis in the proximal vessel (proximal and mid SFA). Critical decrease in bilateral ABI with monophasic waveforms at the level of the ankle.  Right ABI 0.36, left ABI 0.46.  Consider further evaluation for critical limb ischemia if clinically indicated. Compared to the study done on 09/16/1816, right ABI 0.40, left ABI 0.60, overall no significant change in Doppler signal, study may suggest mild progression.  Echocardiogram 05/02/2016: Left ventricle cavity is small. Cavitary obliteration noted and suspect intraventricular PG. Mild concentric remodeling of the left ventricle. Basal septal hypertrophy. Normal global wall motion. Doppler evidence of grade II (pseudonormal) diastolic dysfunction, elevated LAP. Calculated EF 74%. Mild (Grade I) mitral regurgitation. No evidence of mitral valve stenosis. Moderate tricuspid regurgitation. Mild pulmonary hypertension. Pulmonary artery systolic pressure is estimated at 33 mm Hg. Mild pulmonic regurgitation.  Assessment:   PAD (peripheral artery disease) ([facility]) - Plan: CANCELED: EKG 12-Lead  Hypertension with heart disease - Plan: CANCELED: EKG 12-Lead  Hypercholesterolemia  EKG- Sinus rhythm, within normal limits.  Recommendations:   Patient is doing well post procedure.  Patient underwent complex PV angiogram with arthrectomy and left distal stent and left proximal stenting.  She is recovered from her procedure without any complications.  No hematoma is noted.  She does have  critical limb ischemia to her right lower extremity and will need repeat PV angiogram for intervention for the same.  I will arrange this in the next few weeks.  Will schedule for peripheral arteriogram and possible angioplasty given symptoms. Patient understands the risks, benefits, alternatives including medical therapy, CT angiography. Patient understands <1-2% risk of death, embolic complications, bleeding, infection, renal failure, urgent surgical revascularization, but not limited to these and wants to proceed. She will continue with aspirin and Plavix at least for 1 year post intervention.  I  have encouraged her to try to do some walking at least short distances at home to prevent any deconditioning.  Her blood pressure was initially elevated in our office, but on recheck had significantly improved.  Continue with present medications.  She is on pravastatin for management of hyperlipidemia, I am unsure of her recent lipids and will request this from her PCP office.  I will see her back in the next few weeks after repeat PV angiogram.  Plan of care was discussed with her daughter-in-law who is in agreements with the plan.    Ayce Pietrzyk Haynes Yuriana Gaal, MSN, APRN, FNP-C [facility] Office: [phone] Fax: [phone] 

## 2018-11-04 NOTE — Telephone Encounter (Signed)
Called pt's daughter again today 11/04/18 and was able to schedule procedure.

## 2018-11-11 ENCOUNTER — Other Ambulatory Visit: Payer: Self-pay | Admitting: Cardiology

## 2018-11-11 DIAGNOSIS — I119 Hypertensive heart disease without heart failure: Secondary | ICD-10-CM | POA: Diagnosis not present

## 2018-11-11 DIAGNOSIS — I739 Peripheral vascular disease, unspecified: Secondary | ICD-10-CM | POA: Diagnosis not present

## 2018-11-11 DIAGNOSIS — E7800 Pure hypercholesterolemia, unspecified: Secondary | ICD-10-CM

## 2018-11-12 LAB — CBC
Hematocrit: 38.1 % (ref 34.0–46.6)
Hemoglobin: 12.5 g/dL (ref 11.1–15.9)
MCH: 29.8 pg (ref 26.6–33.0)
MCHC: 32.8 g/dL (ref 31.5–35.7)
MCV: 91 fL (ref 79–97)
Platelets: 355 10*3/uL (ref 150–450)
RBC: 4.2 x10E6/uL (ref 3.77–5.28)
RDW: 12.4 % (ref 11.7–15.4)
WBC: 7.4 10*3/uL (ref 3.4–10.8)

## 2018-11-12 LAB — BASIC METABOLIC PANEL
BUN/Creatinine Ratio: 16 (ref 12–28)
BUN: 21 mg/dL (ref 8–27)
CO2: 26 mmol/L (ref 20–29)
Calcium: 10.7 mg/dL — ABNORMAL HIGH (ref 8.7–10.3)
Chloride: 101 mmol/L (ref 96–106)
Creatinine, Ser: 1.29 mg/dL — ABNORMAL HIGH (ref 0.57–1.00)
GFR calc Af Amer: 44 mL/min/{1.73_m2} — ABNORMAL LOW (ref >59)
GFR calc non Af Amer: 38 mL/min/{1.73_m2} — ABNORMAL LOW (ref >59)
Glucose: 128 mg/dL — ABNORMAL HIGH (ref 65–99)
Potassium: 4.3 mmol/L (ref 3.5–5.2)
Sodium: 143 mmol/L (ref 134–144)

## 2018-11-20 ENCOUNTER — Other Ambulatory Visit (HOSPITAL_COMMUNITY)
Admission: RE | Admit: 2018-11-20 | Discharge: 2018-11-20 | Disposition: A | Discharge status: Home / Self Care | Payer: Medicare Other | Source: Ambulatory Visit | Attending: Cardiology | Admitting: Cardiology

## 2018-11-20 DIAGNOSIS — Z20828 Contact with and (suspected) exposure to other viral communicable diseases: Secondary | ICD-10-CM | POA: Diagnosis not present

## 2018-11-20 DIAGNOSIS — Z01812 Encounter for preprocedural laboratory examination: Secondary | ICD-10-CM | POA: Insufficient documentation

## 2018-11-22 ENCOUNTER — Encounter (HOSPITAL_COMMUNITY): Payer: Self-pay | Admitting: Cardiology

## 2018-11-23 LAB — NOVEL CORONAVIRUS, NAA (HOSP ORDER, SEND-OUT TO REF LAB; TAT 18-24 HRS): SARS-CoV-2, NAA: NOT DETECTED

## 2018-11-24 ENCOUNTER — Encounter (HOSPITAL_COMMUNITY)
Admission: RE | Disposition: A | Discharge status: Home / Self Care | Payer: Self-pay | Source: Home / Self Care | Attending: Cardiology

## 2018-11-24 ENCOUNTER — Encounter (HOSPITAL_COMMUNITY): Payer: Self-pay | Admitting: *Deleted

## 2018-11-24 ENCOUNTER — Ambulatory Visit (HOSPITAL_COMMUNITY)
Admission: RE | Admit: 2018-11-24 | Discharge: 2018-11-25 | Disposition: A | Discharge status: Home / Self Care | Payer: Medicare Other | Attending: Cardiology | Admitting: Cardiology

## 2018-11-24 ENCOUNTER — Other Ambulatory Visit: Payer: Self-pay

## 2018-11-24 DIAGNOSIS — Z7902 Long term (current) use of antithrombotics/antiplatelets: Secondary | ICD-10-CM | POA: Insufficient documentation

## 2018-11-24 DIAGNOSIS — Z7984 Long term (current) use of oral hypoglycemic drugs: Secondary | ICD-10-CM | POA: Insufficient documentation

## 2018-11-24 DIAGNOSIS — X58XXXA Exposure to other specified factors, initial encounter: Secondary | ICD-10-CM | POA: Insufficient documentation

## 2018-11-24 DIAGNOSIS — S301XXA Contusion of abdominal wall, initial encounter: Secondary | ICD-10-CM | POA: Insufficient documentation

## 2018-11-24 DIAGNOSIS — Z7982 Long term (current) use of aspirin: Secondary | ICD-10-CM | POA: Insufficient documentation

## 2018-11-24 DIAGNOSIS — S3023XA Contusion of vagina and vulva, initial encounter: Secondary | ICD-10-CM | POA: Insufficient documentation

## 2018-11-24 DIAGNOSIS — I10 Essential (primary) hypertension: Secondary | ICD-10-CM | POA: Diagnosis not present

## 2018-11-24 DIAGNOSIS — I70211 Atherosclerosis of native arteries of extremities with intermittent claudication, right leg: Secondary | ICD-10-CM | POA: Diagnosis not present

## 2018-11-24 DIAGNOSIS — Z79899 Other long term (current) drug therapy: Secondary | ICD-10-CM | POA: Insufficient documentation

## 2018-11-24 DIAGNOSIS — I739 Peripheral vascular disease, unspecified: Secondary | ICD-10-CM | POA: Diagnosis present

## 2018-11-24 DIAGNOSIS — E1151 Type 2 diabetes mellitus with diabetic peripheral angiopathy without gangrene: Secondary | ICD-10-CM | POA: Diagnosis not present

## 2018-11-24 DIAGNOSIS — E785 Hyperlipidemia, unspecified: Secondary | ICD-10-CM | POA: Insufficient documentation

## 2018-11-24 DIAGNOSIS — E1140 Type 2 diabetes mellitus with diabetic neuropathy, unspecified: Secondary | ICD-10-CM | POA: Diagnosis not present

## 2018-11-24 DIAGNOSIS — E7800 Pure hypercholesterolemia, unspecified: Secondary | ICD-10-CM | POA: Insufficient documentation

## 2018-11-24 HISTORY — PX: LOWER EXTREMITY ANGIOGRAPHY: CATH118251

## 2018-11-24 HISTORY — PX: PERIPHERAL VASCULAR BALLOON ANGIOPLASTY: CATH118281

## 2018-11-24 HISTORY — PX: PERIPHERAL VASCULAR ATHERECTOMY: CATH118256

## 2018-11-24 LAB — HEMOGLOBIN AND HEMATOCRIT, BLOOD
HCT: 30.8 % — ABNORMAL LOW (ref 36.0–46.0)
HCT: 29.8 % — ABNORMAL LOW (ref 36.0–46.0)
HCT: 31.3 % — ABNORMAL LOW (ref 36.0–46.0)
Hemoglobin: 9.3 g/dL — ABNORMAL LOW (ref 12.0–15.0)
Hemoglobin: 9.4 g/dL — ABNORMAL LOW (ref 12.0–15.0)
Hemoglobin: 10 g/dL — ABNORMAL LOW (ref 12.0–15.0)

## 2018-11-24 LAB — POCT I-STAT, CHEM 8
BUN: 28 mg/dL — ABNORMAL HIGH (ref 8–23)
BUN: 20 mg/dL (ref 8–23)
Calcium, Ion: 1.07 mmol/L — ABNORMAL LOW (ref 1.15–1.40)
Calcium, Ion: 1.17 mmol/L (ref 1.15–1.40)
Chloride: 104 mmol/L (ref 98–111)
Chloride: 106 mmol/L (ref 98–111)
Creatinine, Ser: 1 mg/dL (ref 0.44–1.00)
Creatinine, Ser: 0.7 mg/dL (ref 0.44–1.00)
Glucose, Bld: 131 mg/dL — ABNORMAL HIGH (ref 70–99)
Glucose, Bld: 165 mg/dL — ABNORMAL HIGH (ref 70–99)
HCT: 42 % (ref 36.0–46.0)
HCT: 32 % — ABNORMAL LOW (ref 36.0–46.0)
Hemoglobin: 14.3 g/dL (ref 12.0–15.0)
Hemoglobin: 10.9 g/dL — ABNORMAL LOW (ref 12.0–15.0)
Potassium: 3.5 mmol/L (ref 3.5–5.1)
Potassium: 3.8 mmol/L (ref 3.5–5.1)
Sodium: 139 mmol/L (ref 135–145)
Sodium: 140 mmol/L (ref 135–145)
TCO2: 24 mmol/L (ref 22–32)
TCO2: 21 mmol/L — ABNORMAL LOW (ref 22–32)

## 2018-11-24 LAB — POCT ACTIVATED CLOTTING TIME
Activated Clotting Time: 268 seconds
Activated Clotting Time: 268 seconds
Activated Clotting Time: 307 seconds
Activated Clotting Time: 296 seconds
Activated Clotting Time: 285 seconds
Activated Clotting Time: 202 seconds

## 2018-11-24 LAB — MRSA PCR SCREENING: MRSA by PCR: NEGATIVE

## 2018-11-24 SURGERY — LOWER EXTREMITY ANGIOGRAPHY
Anesthesia: LOCAL | Laterality: Right

## 2018-11-24 MED ORDER — LIDOCAINE HCL (PF) 1 % IJ SOLN
INTRAMUSCULAR | Status: DC | PRN
  Administered 2018-11-24 (×2): 15 mL via INTRADERMAL

## 2018-11-24 MED ORDER — SODIUM CHLORIDE 0.9 % IV SOLN: 250.0000 mL | INTRAVENOUS | Status: DC | PRN

## 2018-11-24 MED ORDER — HEPARIN SODIUM (PORCINE) 1000 UNIT/ML IJ SOLN
INTRAMUSCULAR | Status: AC
  Filled 2018-11-24: qty 1

## 2018-11-24 MED ORDER — CLOPIDOGREL BISULFATE 75 MG PO TABS
75.0000 mg | ORAL_TABLET | Freq: Once | ORAL | Status: AC
  Administered 2018-11-24: 75 mg via ORAL
  Filled 2018-11-24: qty 1

## 2018-11-24 MED ORDER — MIDAZOLAM HCL 2 MG/2ML IJ SOLN
INTRAMUSCULAR | Status: DC | PRN
  Administered 2018-11-24: 1 mg via INTRAVENOUS

## 2018-11-24 MED ORDER — FENTANYL CITRATE (PF) 100 MCG/2ML IJ SOLN: 50.0000 ug | Freq: Once | INTRAMUSCULAR | Status: DC

## 2018-11-24 MED ORDER — SODIUM CHLORIDE 0.9% FLUSH: 3.0000 mL | INTRAVENOUS | Status: DC | PRN

## 2018-11-24 MED ORDER — IODIXANOL 320 MG/ML IV SOLN
INTRAVENOUS | Status: DC | PRN
  Administered 2018-11-24: 100 mL via INTRA_ARTERIAL

## 2018-11-24 MED ORDER — SODIUM CHLORIDE 0.9 % IV SOLN
INTRAVENOUS | Status: DC
  Administered 2018-11-24: 10:00:00 via INTRAVENOUS
  Administered 2018-11-24: 250 mL via INTRAVENOUS

## 2018-11-24 MED ORDER — HEPARIN SODIUM (PORCINE) 1000 UNIT/ML IJ SOLN
INTRAMUSCULAR | Status: DC | PRN
  Administered 2018-11-24 (×3): 3000 [IU] via INTRAVENOUS
  Administered 2018-11-24: 8000 [IU] via INTRAVENOUS

## 2018-11-24 MED ORDER — LIDOCAINE-EPINEPHRINE 1 %-1:100000 IJ SOLN
INTRAMUSCULAR | Status: DC | PRN
  Administered 2018-11-24: 2 mL

## 2018-11-24 MED ORDER — LIDOCAINE HCL (PF) 1 % IJ SOLN
INTRAMUSCULAR | Status: AC
  Filled 2018-11-24: qty 30

## 2018-11-24 MED ORDER — NITROGLYCERIN IN D5W 200-5 MCG/ML-% IV SOLN
INTRAVENOUS | Status: AC
  Filled 2018-11-24: qty 250

## 2018-11-24 MED ORDER — ACETAMINOPHEN 325 MG PO TABS: 650.0000 mg | ORAL_TABLET | ORAL | Status: DC | PRN

## 2018-11-24 MED ORDER — LABETALOL HCL 5 MG/ML IV SOLN: 10.0000 mg | INTRAVENOUS | Status: DC | PRN

## 2018-11-24 MED ORDER — CHLORHEXIDINE GLUCONATE CLOTH 2 % EX PADS: 6.0000 | MEDICATED_PAD | Freq: Every day | CUTANEOUS | Status: DC

## 2018-11-24 MED ORDER — FENTANYL CITRATE (PF) 100 MCG/2ML IJ SOLN
INTRAMUSCULAR | Status: DC | PRN
  Administered 2018-11-24 (×2): 25 ug via INTRAVENOUS

## 2018-11-24 MED ORDER — HEPARIN (PORCINE) IN NACL 1000-0.9 UT/500ML-% IV SOLN
INTRAVENOUS | Status: DC | PRN
  Administered 2018-11-24 (×2): 500 mL

## 2018-11-24 MED ORDER — MIDAZOLAM HCL 2 MG/2ML IJ SOLN
INTRAMUSCULAR | Status: AC
  Filled 2018-11-24: qty 2

## 2018-11-24 MED ORDER — SODIUM CHLORIDE 0.9 % IV BOLUS
500.0000 mL | Freq: Once | INTRAVENOUS | Status: AC
  Administered 2018-11-24: 06:00:00 500 mL via INTRAVENOUS

## 2018-11-24 MED ORDER — LIDOCAINE-EPINEPHRINE 1 %-1:100000 IJ SOLN
INTRAMUSCULAR | Status: AC
  Filled 2018-11-24: qty 1

## 2018-11-24 MED ORDER — NITROGLYCERIN 1 MG/10 ML FOR IR/CATH LAB
INTRA_ARTERIAL | Status: DC | PRN
  Administered 2018-11-24: 300 ug via INTRA_ARTERIAL
  Administered 2018-11-24: 200 ug via INTRA_ARTERIAL
  Administered 2018-11-24: 400 ug via INTRA_ARTERIAL
  Administered 2018-11-24: 300 ug via INTRA_ARTERIAL
  Administered 2018-11-24: 600 ug
  Administered 2018-11-24: 200 ug via INTRA_ARTERIAL
  Administered 2018-11-24: 300 ug via INTRA_ARTERIAL

## 2018-11-24 MED ORDER — SODIUM CHLORIDE 0.9% FLUSH
3.0000 mL | Freq: Two times a day (BID) | INTRAVENOUS | Status: DC
  Administered 2018-11-25: 3 mL via INTRAVENOUS

## 2018-11-24 MED ORDER — VIPERSLIDE LUBRICANT OPTIME
TOPICAL | Status: DC | PRN
  Administered 2018-11-24: 20 mL via SURGICAL_CAVITY

## 2018-11-24 MED ORDER — FENTANYL CITRATE (PF) 100 MCG/2ML IJ SOLN
INTRAMUSCULAR | Status: AC
  Filled 2018-11-24: qty 2

## 2018-11-24 MED ORDER — HYDRALAZINE HCL 20 MG/ML IJ SOLN: 5.0000 mg | INTRAMUSCULAR | Status: DC | PRN

## 2018-11-24 MED ORDER — ONDANSETRON HCL 4 MG/2ML IJ SOLN
4.0000 mg | Freq: Four times a day (QID) | INTRAMUSCULAR | Status: DC | PRN
  Administered 2018-11-24: 4 mg via INTRAVENOUS

## 2018-11-24 MED ORDER — SODIUM CHLORIDE 0.9 % IV SOLN
INTRAVENOUS | Status: AC
  Administered 2018-11-24: 100 mL/h via INTRAVENOUS

## 2018-11-24 MED ORDER — HEPARIN (PORCINE) IN NACL 1000-0.9 UT/500ML-% IV SOLN
INTRAVENOUS | Status: AC
  Filled 2018-11-24: qty 1000

## 2018-11-24 MED ORDER — ONDANSETRON HCL 4 MG/2ML IJ SOLN
INTRAMUSCULAR | Status: DC | PRN
  Administered 2018-11-24: 4 mg via INTRAVENOUS

## 2018-11-24 MED ORDER — VERAPAMIL HCL 2.5 MG/ML IV SOLN
INTRAVENOUS | Status: AC
  Filled 2018-11-24: qty 2

## 2018-11-24 MED ORDER — SODIUM CHLORIDE 0.9% FLUSH: 3.0000 mL | Freq: Two times a day (BID) | INTRAVENOUS | Status: DC

## 2018-11-24 MED ORDER — ONDANSETRON HCL 4 MG/2ML IJ SOLN
INTRAMUSCULAR | Status: AC
  Filled 2018-11-24: qty 2

## 2018-11-24 MED ORDER — ASPIRIN 81 MG PO CHEW
81.0000 mg | CHEWABLE_TABLET | Freq: Once | ORAL | Status: AC
  Administered 2018-11-24: 81 mg via ORAL
  Filled 2018-11-24: qty 1

## 2018-11-24 SURGICAL SUPPLY — 38 items
BALLN IN.PACT DCB 5X150 (BALLOONS) ×2
BALLN STERLING OTW 2X150X150 (BALLOONS) ×2
BALLN STERLING OTW 3X220X150 (BALLOONS) ×2
BALLN STERLING OTW 5X150X150 (BALLOONS) ×2
BALLOON STERLING OTW 2X150X150 (BALLOONS) IMPLANT
BALLOON STERLING OTW 3X220X150 (BALLOONS) IMPLANT
BALLOON STERLING OTW 5X150X150 (BALLOONS) IMPLANT
CATH CROSS OVER TEMPO 5F (CATHETERS) ×1 IMPLANT
CATH OMNI FLUSH 5F 65CM (CATHETERS) ×1 IMPLANT
CATH QUICKCROSS ANG SELECT (CATHETERS) ×1 IMPLANT
CATH TEMPO AQUA 5F 100CM (CATHETERS) ×1 IMPLANT
CLOSURE MYNX CONTROL 5F (Vascular Products) ×1 IMPLANT
CLOSURE MYNX CONTROL 6F/7F (Vascular Products) IMPLANT
DCB IN.PACT 5X150 (BALLOONS) IMPLANT
DEVICE CONTINUOUS FLUSH (MISCELLANEOUS) ×1 IMPLANT
DIAMONDBACK SOLID OAS 1.5MM (CATHETERS) ×2
FEM STOP ARCH (HEMOSTASIS) ×1
GUIDEWIRE ZILIENT 6G 018 (WIRE) ×2 IMPLANT
KIT ENCORE 26 ADVANTAGE (KITS) ×1 IMPLANT
KIT MICROPUNCTURE NIT STIFF (SHEATH) ×1 IMPLANT
KIT PV (KITS) ×2 IMPLANT
LUBRICANT VIPERSLIDE CORONARY (MISCELLANEOUS) ×1 IMPLANT
PATCH THROMBIX TOPICAL PLAIN (HEMOSTASIS) ×1 IMPLANT
SHEATH PINNACLE 5F 10CM (SHEATH) ×1 IMPLANT
SHEATH PINNACLE 7F 10CM (SHEATH) ×1 IMPLANT
SHEATH PINNACLE 8F 10CM (SHEATH) ×1 IMPLANT
SHEATH PINNACLE MP 7F 45CM (SHEATH) ×1 IMPLANT
SHEATH PROBE COVER 6X72 (BAG) ×2 IMPLANT
SHIELD RADPAD SCOOP 12X17 (MISCELLANEOUS) ×1 IMPLANT
STOPCOCK MORSE 400PSI 3WAY (MISCELLANEOUS) ×1 IMPLANT
SYR MEDRAD MARK 7 150ML (SYRINGE) ×2 IMPLANT
SYSTEM COMPRESSION FEMOSTOP (HEMOSTASIS) IMPLANT
SYSTEM DIMNDBCK SLD OAS 1.5MM (CATHETERS) IMPLANT
TRANSDUCER W/STOPCOCK (MISCELLANEOUS) ×2 IMPLANT
TRAY PV CATH (CUSTOM PROCEDURE TRAY) ×2 IMPLANT
TUBING CIL FLEX 10 FLL-RA (TUBING) ×1 IMPLANT
WIRE BENTSON .035X145CM (WIRE) ×1 IMPLANT
WIRE VIPER ADVANCE .017X335CM (WIRE) ×2 IMPLANT

## 2018-11-24 NOTE — Plan of Care (Signed)

## 2018-11-24 NOTE — Progress Notes (Signed)
Doctor Patwardhan wants to know if Hgb <7 and will transfuse patient at that time.

## 2018-11-24 NOTE — Progress Notes (Signed)
Patient had 2 episodes of hypotension, bradycardia, 1 of these episodes was also associated with brief respiratory arrest.  These episodes occurred during pressure holding after sheath removal.  Strong vagal responses most likely to left groin hematoma is improving.  Will need serial H&H monitoring.  Retroperitoneal hematoma remains in the differential, although less likely based on final angiogram showing no arterial bleed.  Will place FemoStop and monitor patient into heart tonight.  Patient is currently hemodynamically stable.  If she has persistent hypertension, will consider blood transfusion and stat CT scan.  Nigel Mormon, MD [facility] Pager: [phone] Office: [phone] If no answer Cell [phone]

## 2018-11-24 NOTE — Progress Notes (Signed)
left groin hematoma very tight and hard, act 180. 8Fr sheath aspirated and removed from Left femoral artery. Manual pressure applied.  5 minutes into hold, patient felt nauseated and HR dropped from 75 to 40bpm. Patient suddenly became unresponsive and respiratory arrested for about 45 seconds. Spo2 had no waveform. Patient spontaneously started to breath again, just before being ambu-bagged. Dr. Shanon Brow arrive to assess patient.  Groin was held for 30 minutes, then a femostop applied at 40mm at 14:55.    Bedrest instuctions given, site level 2. Hematoma softer, but large area of ecchymosis present from lateral area of inigual ligament to labia.   Hematoma ecchymotic area 12 inches by 7 inches.  Bedrest begins at 14:55:00

## 2018-11-24 NOTE — Progress Notes (Signed)
Patient arrive to holding area with 71fr arterial sheath present in left femoral artery. Large oblong area of hematoma present aprox 12 inches x 7 inches. Manual pressure applied around sheath site, until groin was softer. Hematoma site was marked in the pv lab prior to holding area. Act 202.

## 2018-11-24 NOTE — Interval H&P Note (Signed)
History and Physical Interval Note:  11/24/2018 7:40 AM  Angela Benitez  has presented today for surgery, with the diagnosis of claudication.  The various methods of treatment have been discussed with the patient and family. After consideration of risks, benefits and other options for treatment, the patient has consented to  Procedure(s): LOWER EXTREMITY ANGIOGRAPHY (Right) as a surgical intervention and angioplasty.  The patient's history has been reviewed, patient examined, no change in status, stable for surgery.  I have reviewed the patient's chart and labs.  Questions were answered to the patient's satisfaction.     Adrian Prows

## 2018-11-24 NOTE — Progress Notes (Signed)
I left message with her daughter. Unable to reach her son to update. JG

## 2018-11-25 ENCOUNTER — Telehealth: Payer: Self-pay

## 2018-11-25 ENCOUNTER — Encounter (HOSPITAL_COMMUNITY): Payer: Self-pay | Admitting: Cardiology

## 2018-11-25 ENCOUNTER — Ambulatory Visit (HOSPITAL_BASED_OUTPATIENT_CLINIC_OR_DEPARTMENT_OTHER): Payer: Medicare Other

## 2018-11-25 DIAGNOSIS — I739 Peripheral vascular disease, unspecified: Secondary | ICD-10-CM

## 2018-11-25 DIAGNOSIS — E1151 Type 2 diabetes mellitus with diabetic peripheral angiopathy without gangrene: Secondary | ICD-10-CM | POA: Diagnosis not present

## 2018-11-25 DIAGNOSIS — I70211 Atherosclerosis of native arteries of extremities with intermittent claudication, right leg: Secondary | ICD-10-CM | POA: Diagnosis not present

## 2018-11-25 DIAGNOSIS — T148XXA Other injury of unspecified body region, initial encounter: Secondary | ICD-10-CM | POA: Diagnosis not present

## 2018-11-25 DIAGNOSIS — E1140 Type 2 diabetes mellitus with diabetic neuropathy, unspecified: Secondary | ICD-10-CM | POA: Diagnosis not present

## 2018-11-25 DIAGNOSIS — E785 Hyperlipidemia, unspecified: Secondary | ICD-10-CM | POA: Diagnosis not present

## 2018-11-25 DIAGNOSIS — Z7982 Long term (current) use of aspirin: Secondary | ICD-10-CM | POA: Diagnosis not present

## 2018-11-25 DIAGNOSIS — E7800 Pure hypercholesterolemia, unspecified: Secondary | ICD-10-CM | POA: Diagnosis not present

## 2018-11-25 DIAGNOSIS — I10 Essential (primary) hypertension: Secondary | ICD-10-CM | POA: Diagnosis not present

## 2018-11-25 LAB — HEMOGLOBIN AND HEMATOCRIT, BLOOD
HCT: 28.5 % — ABNORMAL LOW (ref 36.0–46.0)
Hemoglobin: 9.2 g/dL — ABNORMAL LOW (ref 12.0–15.0)

## 2018-11-25 LAB — POCT ACTIVATED CLOTTING TIME: Activated Clotting Time: 180 seconds

## 2018-11-25 MED ORDER — METFORMIN HCL 1000 MG PO TABS: 1000.0000 mg | ORAL_TABLET | Freq: Two times a day (BID) | ORAL | Status: DC

## 2018-11-25 NOTE — Progress Notes (Signed)
Patient being discharged per MD order. IV removed. VSS. Discharge summary gone over with patient and patient's daughter. Patient wheeled down in wheelchair by NT.

## 2018-11-25 NOTE — Discharge Summary (Signed)
Physician Discharge Summary  Patient ID: Angela Benitez MRN: [id number] DOB/AGE: 10/11/1933 85 y.o.  Admit date: 11/24/2018 Discharge date: 11/25/2018  Primary Discharge Diagnosis Peripheral arterial disease with lifestyle limiting claudication 2.  Diabetes mellitus type 2 controlled with vascular disease 3.  Left groin hematoma  Secondary Discharge Diagnosis Essential hypertension Hyperlipidemia  Significant Diagnostic Studies: Peripheral arteriogram 11/24/2018: Right SFA, right popliteal, right TP trunk atherectomy followed by balloon angioplasty, stenosis reduced from 80 to 90% in the SFA to less than 10% and subtotal greater than 99% occlusion of the right popliteal and TP trunk to less than 10%.  Peripheral arteriogram 10/27/2018: Long diffuse left mid to distal SFA stenosis involving 60 to 95% stenosis SP directional atherectomy followed by drug-coated balloon angioplasty, stenosis reduced to 0% stenosis.  Has residual right SFA and popliteal disease, needs elective angioplasty given her symptoms.  Limited lower extremity arterial duplex 11/25/18  Bilateral: No evidence of pseudoaneurysm, arteriovenous fistula, or DVT involving bilateral common femoral veins. There area areas of vascularization extending superficially from the CFA in bilateral groins with monophasic flow; suggestive of possibly  vascularized needle tract.  ABI 11/25/18  Current ABI: Right=1.15 left=0.86 Right: Resting right ankle-brachial index indicates noncompressible right lower extremity arteries. The right toe-brachial index is abnormal, 0.25. ABIs are unreliable. Left: Resting left ankle-brachial index indicates mild left lower extremity arterial disease. The left toe-brachial index is abnormal, 0.13.   ABI 09/26/2018: Right ABI 0.36, left ABI 0.46.   Hospital Course: Patient was admitted to the hospital electively for peripheral arteriogram and possible angioplasty.  Angela Benitez underwent complex but successful  atherectomy followed by balloon angioplasty to the right SFA and right below-knee vessels, was a prolonged procedure given the complexity of her anatomy.  Postprocedure patient developed large left groin hematoma.  In view of planned procedure and her age as risk factors, we admitted the patient for evaluation.  In the recovery room, patient developed vasovagal episode, unresponsiveness for about 45 seconds per Lab tech however patient spontaneously recovered and remained stable without any neurologic deficits.  Patient was then transferred to the ICU for close observation.  Angela Benitez remained stable throughout the rest of the course.  Angela Benitez did have a 2.5 to 3 g drop in hemoglobin related to large left groin hematoma.  Recommendations on discharge: Patient will be continued on aggressive risk factor modification, continue statins and dual antiplatelet therapy for now, fortunately there is no pseudoaneurysm and hematoma is contained.  Patient felt to be ready for discharge and stable.  Discharge Exam: Blood pressure (!) 124/49, pulse (!) 50, temperature 98.9 F (37.2 C), temperature source Oral, resp. rate 14, height 5\' 3"  (1.6 m), weight 61.8 kg, SpO2 (!) 89 %.  Physical Exam  Constitutional: Angela Benitez is oriented to person, place, and time. Vital signs are normal.  Angela Benitez is moderately built and well-nourished in no acute distress.  HENT:  Head: Normocephalic and atraumatic.  Neck: Normal range of motion.  Cardiovascular: Normal rate, regular rhythm and intact distal pulses.  Murmur heard.  Early systolic murmur is present with a grade of 1/6 at the upper right sternal border. Pulses:      Femoral pulses are 1+ on the right side and 2+ on the left side.      Popliteal pulses are 2+ on the right side and 1+ on the left side.       Dorsalis pedis pulses are 0 on the right side and 0 on the left side.  Posterior tibial pulses are 0 on the right side and 0 on the left side.  Very large right groin hematoma  noted. No leg edema.  Capillary refill normal.  Pulmonary/Chest: Effort normal and breath sounds normal. No accessory muscle usage. No respiratory distress.  Abdominal: Soft. Bowel sounds are normal. There is abdominal tenderness (left groin).  Musculoskeletal: Normal range of motion.  Neurological: Angela Benitez is alert and oriented to person, place, and time.  Skin: Skin is warm and dry.  Vitals reviewed.   Labs:   Lab Results  Component Value Date   WBC 7.4 11/11/2018   HGB 9.2 (L) 11/24/2018   HCT 28.5 (L) 11/24/2018   MCV 91 11/11/2018   PLT 355 11/11/2018    Recent Labs  Lab 11/24/18 1437  NA 140  K 3.8  CL 106  BUN 20  CREATININE 0.70  GLUCOSE 165*    Lipid Panel  No results found for: CHOL, TRIG, HDL, CHOLHDL, VLDL, LDLCALC  BNP (last 3 results) No results for input(s): BNP in the last 8760 hours.  HEMOGLOBIN A1C No results found for: HGBA1C, MPG  Cardiac Panel (last 3 results) No results for input(s): CKTOTAL, CKMB, TROPONINI, RELINDX in the last 8760 hours.  No results found for: CKTOTAL, CKMB, CKMBINDEX, TROPONINI   TSH No results for input(s): TSH in the last 8760 hours.  Radiology: Vas [place] Abi With/wo Tbi  Result Date: 11/25/2018 LOWER EXTREMITY DOPPLER STUDY Indications: Claudication, and peripheral artery disease.  Comparison Study: No prior study on file for comparison. Performing Technologist: Sherren Kerns RVS  Examination Guidelines: A complete evaluation includes at minimum, Doppler waveform signals and systolic blood pressure reading at the level of bilateral brachial, anterior tibial, and posterior tibial arteries, when vessel segments are accessible. Bilateral testing is considered an integral part of a complete examination. Photoelectric Plethysmograph (PPG) waveforms and toe systolic pressure readings are included as required and additional duplex testing as needed. Limited examinations for reoccurring indications may be performed as noted.  ABI  Findings: +---------+------------------+-----+----------+--------+ Right    Rt Pressure (mmHg)IndexWaveform  Comment  +---------+------------------+-----+----------+--------+ Brachial 129                    monophasic         +---------+------------------+-----+----------+--------+ PTA      153               1.08 monophasic         +---------+------------------+-----+----------+--------+ DP       164               1.15 monophasic         +---------+------------------+-----+----------+--------+ Great Toe36                0.25                    +---------+------------------+-----+----------+--------+ +---------+------------------+-----+----------+-------+ Left     Lt Pressure (mmHg)IndexWaveform  Comment +---------+------------------+-----+----------+-------+ Brachial 142                                      +---------+------------------+-----+----------+-------+ PTA      122               0.86 monophasic        +---------+------------------+-----+----------+-------+ DP       35                0.25 monophasic        +---------+------------------+-----+----------+-------+  Great Toe19                0.13                   +---------+------------------+-----+----------+-------+ +-------+-----------+-----------+------------+------------+ ABI/TBIToday's ABIToday's TBIPrevious ABIPrevious TBI +-------+-----------+-----------+------------+------------+ Right  1.15       0.25                                +-------+-----------+-----------+------------+------------+ Left   0.86                                           +-------+-----------+-----------+------------+------------+  Summary: Right: Resting right ankle-brachial index indicates noncompressible right lower extremity arteries. The right toe-brachial index is abnormal. ABIs are unreliable. Left: Resting left ankle-brachial index indicates mild left lower extremity arterial disease. The left  toe-brachial index is abnormal.  *See table(s) above for measurements and observations.    Preliminary    Vas [place] Lower Extremity Arterial Duplex  Result Date: 11/25/2018 LOWER EXTREMITY ARTERIAL DUPLEX STUDY Indications: Bilateral groin hematoma status post bilateral groin stick for              arteriogram. High Risk Factors: Hypertension, hyperlipidemia, Diabetes. Other Factors: PAD.  Current ABI: Right=1.15 left=0.86 Comparison Study: 09/16/2018 lower extremity arterial duplex and ABI. Performing Technologist: Gertie FeyMichelle Simonetti MHA, RDMS, RVT, RDCS  Examination Guidelines: A complete evaluation includes B-mode imaging, spectral Doppler, color Doppler, and power Doppler as needed of all accessible portions of each vessel. Bilateral testing is considered an integral part of a complete examination. Limited examinations for reoccurring indications may be performed as noted.  +----------+--------+-----+--------+--------+--------+ RIGHT     PSV cm/sRatioStenosisWaveformComments +----------+--------+-----+--------+--------+--------+ CFA Distal199                  biphasic         +----------+--------+-----+--------+--------+--------+  +----------+--------+-----+--------+--------+--------+ LEFT      PSV cm/sRatioStenosisWaveformComments +----------+--------+-----+--------+--------+--------+ CFA Distal170                  biphasic         +----------+--------+-----+--------+--------+--------+  Summary: Bilateral: No evidence of pseudoaneurysm, arteriovenous fistula, or DVT involving bilateral common femoral veins. There area areas of vascularization extending superficially from the CFA in bilateral groins with monophasic flow; suggestive of possibly vascularized needle tract.  See table(s) above for measurements and observations.    Preliminary    FOLLOW UP PLANS AND APPOINTMENTS  Allergies as of 11/25/2018   No Known Allergies     Medication List    TAKE these medications    acetaminophen 500 MG tablet Commonly known as: TYLENOL Take 500 mg by mouth every 8 (eight) hours as needed for moderate pain or headache.   aspirin EC 81 MG tablet Take 81 mg by mouth daily.   clopidogrel 75 MG tablet Commonly known as: PLAVIX Take 1 tablet (75 mg total) by mouth daily.   fosinopril 40 MG tablet Commonly known as: MONOPRIL Take 40 mg by mouth daily.   gabapentin 100 MG capsule Commonly known as: NEURONTIN Take 200 mg by mouth at bedtime.   glimepiride 2 MG tablet Commonly known as: AMARYL Take 2-3 mg by mouth See admin instructions. Take 1.5 tablets (3 mg) at breakfast and 1 tablet (2mg ) at bedtime   hydrochlorothiazide 25 MG tablet Commonly known as: HYDRODIURIL Take 25 mg  by mouth daily.   latanoprost 0.005 % ophthalmic solution Commonly known as: XALATAN Place 1 drop into both eyes at bedtime.   metFORMIN 1000 MG tablet Commonly known as: GLUCOPHAGE Take 1 tablet (1,000 mg total) by mouth 2 (two) times daily with a meal. Start taking on: November 27, 2018 What changed:   when to take this  These instructions start on November 27, 2018. If you are unsure what to do until then, ask your doctor or other care provider.   metoprolol tartrate 50 MG tablet Commonly known as: LOPRESSOR Take 50 mg by mouth daily.   pravastatin 20 MG tablet Commonly known as: PRAVACHOL Take 20 mg by mouth daily.      Follow-up Information    Adrian Prows, MD Follow up.   Specialty: Cardiology Why: Keep previous appointment Contact information: [place] [id number] [phone]          Adrian Prows, MD 11/25/2018, 5:31 PM  Pager: [phone] Office: [phone] If no answer: [phone]

## 2018-11-25 NOTE — Progress Notes (Signed)
Limited lower extremity arterial duplex completed.  11/25/2018 2:23 PM Maudry Mayhew, MHA, RVT, RDCS, RDMS

## 2018-11-25 NOTE — Progress Notes (Signed)
Doing well. Left labial and suprapubid hematoma noted. No bruit. Will check ABI and left groin [place] for pseudoaneurysm and discharge home today. Vascular lab aware. JG

## 2018-11-25 NOTE — Progress Notes (Signed)
Paged Ganji MD after ultrasound studies completed. Patient will be discharged home today per MD. Daughter called to update. Awaiting discharge orders.

## 2018-11-25 NOTE — Progress Notes (Signed)
VASCULAR LAB PRELIMINARY  PRELIMINARY  PRELIMINARY  PRELIMINARY  ABIs completed.    Preliminary report:  See CV proc for preliminary results.   Dishon Kehoe, RVT 11/25/2018, 4:14 PM

## 2018-11-25 NOTE — Telephone Encounter (Signed)
Pt daughter Seth Bake called mention that you were going to call her to inform her about pt de that pt is still at the hospital. Her number is [phone]

## 2018-11-25 NOTE — Telephone Encounter (Signed)
I discussed with her, told her that her mom is doing well and she'll be discharged today after ultrasound of the groin and also ABI.

## 2018-12-01 DIAGNOSIS — E113293 Type 2 diabetes mellitus with mild nonproliferative diabetic retinopathy without macular edema, bilateral: Secondary | ICD-10-CM | POA: Diagnosis not present

## 2018-12-04 ENCOUNTER — Other Ambulatory Visit: Payer: Self-pay

## 2018-12-04 ENCOUNTER — Ambulatory Visit (INDEPENDENT_AMBULATORY_CARE_PROVIDER_SITE_OTHER): Payer: Medicare Other | Admitting: Cardiology

## 2018-12-04 ENCOUNTER — Encounter: Payer: Self-pay | Admitting: Cardiology

## 2018-12-04 VITALS — BP 162/65 | HR 75 | Temp 97.3°F | Ht 63.0 in | Wt 135.9 lb

## 2018-12-04 DIAGNOSIS — I119 Hypertensive heart disease without heart failure: Secondary | ICD-10-CM

## 2018-12-04 DIAGNOSIS — E7800 Pure hypercholesterolemia, unspecified: Secondary | ICD-10-CM

## 2018-12-04 DIAGNOSIS — I739 Peripheral vascular disease, unspecified: Secondary | ICD-10-CM | POA: Diagnosis not present

## 2018-12-04 NOTE — Progress Notes (Signed)
Primary Physician:  Clayborn Heronankins, [name], MD   Patient ID: Angela Benitez, female    DOB: Sep 28, 1933, 85 y.o.   MRN: [id number]  Subjective:    Chief Complaint  Patient presents with  . PAD  . PVD    right leg  . Follow-up    4 week    HPI: Angela Benitez  is a 85 y.o. female  with hypertension, diabetes mellitus type 2 and hypercholesterolemia and PAD presents for post procedure f/u.  Due to severe symptoms of claudication, underwent peripheral arteriogram on 10/27/2018 with angioplasty to right SFA and in a staged fashion angioplasty to left SFA on 11/24/2018.  Complex procedure especially the left with severe diffuse disease, patient also developed large hematoma needing hospitalization no blood transfusion.  She did not have any pseudoaneurysm.  ABI had improved.  She now presents for follow-up.   Patient states she is doing well since discharge.  She has not noticed any increased swelling or significant tenderness or drainage from the groin access site.  She has been walking around her house, but has not done any outdoors walking until she was seen by us.  No recurrence of syncopal episodes.  She questionably had a vasovagal syncope in the recovery room without recurrence during her hospitalization.  Tolerating medications well.  Past Medical History:  Diagnosis Date  . Claudication in peripheral vascular disease (HCC) 10/27/2018  . Diabetes mellitus without complication (HCC)   . Hyperlipidemia   . Hypertension   . Mild mitral regurgitation   . Murmur   . Neuropathy   . PAD (peripheral artery disease) (HCC)     Past Surgical History:  Procedure Laterality Date  . ABDOMINAL AORTOGRAM N/A 10/27/2018   Procedure: ABDOMINAL AORTOGRAM;  Surgeon: Elder NegusPatwardhan, Manish J, MD;  Location: [facility];  Service: Cardiovascular;  Laterality: N/A;  . LOWER EXTREMITY ANGIOGRAPHY Bilateral 10/27/2018   Procedure: LOWER EXTREMITY ANGIOGRAPHY;  Surgeon: Elder NegusPatwardhan, Manish J, MD;   Location: [facility];  Service: Cardiovascular;  Laterality: Bilateral;  . LOWER EXTREMITY ANGIOGRAPHY Right 11/24/2018   Procedure: LOWER EXTREMITY ANGIOGRAPHY;  Surgeon: Elder NegusPatwardhan, Manish J, MD;  Location: [facility];  Service: Cardiovascular;  Laterality: Right;  . PERIPHERAL VASCULAR ATHERECTOMY Left 10/27/2018   Procedure: PERIPHERAL VASCULAR ATHERECTOMY;  Surgeon: Elder NegusPatwardhan, Manish J, MD;  Location: [facility];  Service: Cardiovascular;  Laterality: Left;  SFA with DRUG COATED BALLOON  . PERIPHERAL VASCULAR ATHERECTOMY Right 11/24/2018   Procedure: PERIPHERAL VASCULAR ATHERECTOMY;  Surgeon: Elder NegusPatwardhan, Manish J, MD;  Location: [facility];  Service: Cardiovascular;  Laterality: Right;  fem/pop  . PERIPHERAL VASCULAR BALLOON ANGIOPLASTY Right 11/24/2018   Procedure: PERIPHERAL VASCULAR BALLOON ANGIOPLASTY;  Surgeon: Elder NegusPatwardhan, Manish J, MD;  Location: [facility];  Service: Cardiovascular;  Laterality: Right;  fem/pop peroneal    Social History   Socioeconomic History  . Marital status: Widowed    Spouse name: Not on file  . Number of children: 3  . Years of education: Not on file  . Highest education level: Not on file  Occupational History  . Not on file  Social Needs  . Financial resource strain: Not on file  . Food insecurity    Worry: Not on file    Inability: Not on file  . Transportation needs    Medical: Not on file    Non-medical: Not on file  Tobacco Use  . Smoking status: Never Smoker  . Smokeless tobacco:  Never Used  Substance and Sexual Activity  . Alcohol use: No  . Drug use: Not on file  . Sexual activity: Not on file  Lifestyle  . Physical activity    Days per week: Not on file    Minutes per session: Not on file  . Stress: Not on file  Relationships  . Social Musician on phone: Not on file    Gets together: Not on file    Attends religious service: Not on file    Active member of club or  organization: Not on file    Attends meetings of clubs or organizations: Not on file    Relationship status: Not on file  . Intimate partner violence    Fear of current or ex partner: Not on file    Emotionally abused: Not on file    Physically abused: Not on file    Forced sexual activity: Not on file  Other Topics Concern  . Not on file  Social History Narrative  . Not on file    Review of Systems  Constitution: Negative for decreased appetite, malaise/fatigue, weight gain and weight loss.  Eyes: Negative for visual disturbance.  Cardiovascular: Positive for claudication. Negative for chest pain, dyspnea on exertion, leg swelling, orthopnea, palpitations and syncope.  Respiratory: Negative for hemoptysis and wheezing.   Endocrine: Negative for cold intolerance and heat intolerance.  Hematologic/Lymphatic: Does not bruise/bleed easily.  Skin: Negative for nail changes.  Musculoskeletal: Negative for muscle weakness and myalgias.  Gastrointestinal: Negative for abdominal pain, change in bowel habit, nausea and vomiting.  Neurological: Negative for difficulty with concentration, dizziness, focal weakness and headaches.  Psychiatric/Behavioral: Negative for altered mental status and suicidal ideas.  All other systems reviewed and are negative.     Objective:   Vitals with BMI 12/04/2018 11/25/2018 11/25/2018  Height 5\' 3"  - -  Weight 135 lbs 14 oz - -  BMI 24.08 - -  Systolic 162 124  Diastolic 65 49 66  Pulse 75 50 73      Physical Exam  Constitutional: She is oriented to person, place, and time. Vital signs are normal. She appears well-developed and well-nourished.  HENT:  Head: Normocephalic and atraumatic.  Neck: Normal range of motion.  Cardiovascular: Normal rate, regular rhythm and intact distal pulses.  Murmur heard.  Early systolic murmur is present with a grade of 1/6 at the upper right sternal border. Pulses:      Femoral pulses are 1+ on the right side and  1+ on the left side.      Popliteal pulses are 0 on the right side and 0 on the left side.       Dorsalis pedis pulses are 1+ on the right side and 0 on the left side.       Posterior tibial pulses are 2+ on the right side and 2+ on the left side.  Significant hematoma extending into perineum, unchanged from discharge. No bruit or drainage  Pulmonary/Chest: Effort normal and breath sounds normal. No accessory muscle usage. No respiratory distress.  Abdominal: Soft. Bowel sounds are normal.  Musculoskeletal: Normal range of motion.  Neurological: She is alert and oriented to person, place, and time.  Skin: Skin is warm and dry.  Vitals reviewed.  Radiology: No results found.  Laboratory examination:    CMP Latest Ref Rng & Units 11/24/2018 11/24/2018 11/11/2018  Glucose 70 - 99 mg/dL 11/13/2018) 401(U) 272(Z)  BUN 8 - 23 mg/dL 20  28(H) 21  Creatinine 0.44 - 1.00 mg/dL 1.30 8.65 7.84(O)  Sodium 135 - 145 mmol/L 140 139 143  Potassium 3.5 - 5.1 mmol/L 3.8 3.5 4.3  Chloride 98 - 111 mmol/L 106 104 101  CO2 20 - 29 mmol/L - - 26  Calcium 8.7 - 10.3 mg/dL - - 10.7(H)   CBC Latest Ref Rng & Units 11/24/2018 11/24/2018 11/24/2018  WBC 3.4 - 10.8 x10E3/uL - - -  Hemoglobin 12.0 - 15.0 g/dL 9.6(E) 10.0(L) 9.4(L)  Hematocrit 36.0 - 46.0 % 28.5(L) 31.3(L) 29.8(L)  Platelets 150 - 450 x10E3/uL - - -   Lipid Panel  No results found for: CHOL, TRIG, HDL, CHOLHDL, VLDL, LDLCALC, LDLDIRECT HEMOGLOBIN A1C No results found for: HGBA1C, MPG TSH No results for input(s): TSH in the last 8760 hours.  PRN Meds:. There are no discontinued medications. Current Meds  Medication Sig  . acetaminophen (TYLENOL) 500 MG tablet Take 500 mg by mouth every 8 (eight) hours as needed for moderate pain or headache.  Marland Kitchen aspirin EC 81 MG tablet Take 81 mg by mouth daily.  . clopidogrel (PLAVIX) 75 MG tablet Take 1 tablet (75 mg total) by mouth daily.  . fosinopril (MONOPRIL) 40 MG tablet Take 40 mg by mouth daily.    Marland Kitchen gabapentin (NEURONTIN) 100 MG capsule Take 200 mg by mouth at bedtime.   Marland Kitchen glimepiride (AMARYL) 2 MG tablet Take 2-3 mg by mouth See admin instructions. Take 1.5 tablets (3 mg) at breakfast and 1 tablet ( ) at bedtime  . hydrochlorothiazide (HYDRODIURIL) 25 MG tablet Take 25 mg by mouth daily.   Marland Kitchen latanoprost (XALATAN) 0.005 % ophthalmic solution Place 1 drop into both eyes at bedtime.   . metFORMIN (GLUCOPHAGE) 1000 MG tablet Take 1 tablet (1,000 mg total) by mouth 2 (two) times daily with a meal.  . metoprolol tartrate (LOPRESSOR) 50 MG tablet Take 50 mg by mouth daily.   . pravastatin (PRAVACHOL) 20 MG tablet Take 20 mg by mouth daily.     Cardiac Studies:   Echocardiogram 05/02/2016: Left ventricle cavity is small. Cavitary obliteration noted and suspect intraventricular PG. Mild concentric remodeling of the left ventricle. Basal septal hypertrophy. Normal global wall motion. Doppler evidence of grade II (pseudonormal) diastolic dysfunction, elevated LAP. Calculated EF 74%. Mild (Grade I) mitral regurgitation. No evidence of mitral valve stenosis. Moderate tricuspid regurgitation. Mild pulmonary hypertension. Pulmonary artery systolic pressure is estimated at 33 mm Hg. Mild pulmonic regurgitation.  Peripheral arteriogram 10/27/2018:  Right SFA tandem 95% mid stenosis, 1%) atrial artery occlusion.  One-vessel runoff in the form of right anterior tibial.  Successful PTCA with 5.0 X 200 mm In.Pact DCB left distal superficial femoral artery/proximal left poppliteal artery and 5.0 X 60 mm In.Pact DCB left proximal superficial femoral artery      11/24/2018: Diffuse disease in the left SFA from 60-95% stenosis, diffuse disease and popliteal vessel up to 40%, occluded left DP trunk, patent left anterior tibial with distal 100% occlusion at the ankle. Right SFA: Tandem 95% stenoses right mid SFA Subtotally occluded rt popliteal artery/ Rt TP trunk, with reconstitution of all three vessels  below the knee with diffuse distal disease. Atherectomy (1.5 solid [place] Diamonback) Rt mid to distal SFA, Rt popliteal artery, Rt TP trunk PTA 3.0 X 200 mm balloon Rt common peroneal artery to Rt mid SFA PTA 5.0 X 150 mm balloon Rt mid to distal SFA 10% residual stenosis  ABI 11/25/2018: Right: Resting right ankle-brachial index indicates noncompressible right lower  extremity arteries. The right toe-brachial index is abnormal. Non compressible and ABIs are unreliable. Monophasic ABI 1.15. snd TBI 0.25   Left: Resting left ankle-brachial index indicates mild left lower extremity arterial disease. The left toe-brachial index is abnormal. Monophasic ABI 0.86 and TBI 0.25 Compared to 09/16/18, Right ABI 0.40 and Left ABI 0.60.  Lower extremity arterial duplex 11/25/2018: Bilateral: No evidence of pseudoaneurysm, arteriovenous fistula, or DVT involving bilateral common femoral veins. There area areas of vascularization extending superficially from the CFA in bilateral groins with monophasic flow; suggestive of possibly  vascularized needle tract.  Assessment:     ICD-10-CM   1. PAD (peripheral artery disease) (HCC)  I73.9   2. Hypercholesterolemia  E78.00   3. Hypertension with heart disease  I11.9     EKG 07/13/2018: Sinus rhythm, within normal limits.  Recommendations:   Patient presents for follow-up after undergoing complex right SFA arthrectomy and angioplasty, postprocedure developed significant left groin hematoma.  Fortunately was without evidence of pseudoaneurysm by ultrasound.  Patient has minimal tenderness and has not had any worsening swelling to her left groin.  Hematoma appears to be stable.  She has had significant improvement in ABI compared to her previous preprocedure ABI.  We will continue with dual antiplatelet therapy.  I have encouraged her to start regular walking, no restrictions from our standpoint.  Patient and her daughter-in-law were counseled on the importance of  watching her groin site for any worsening swelling or discoloration to her lower extremities.  I do feel that she would be a good candidate for cardiac rehab to improve her exercise tolerance.  Will place referral for this.  Her blood pressure was initially elevated in office, but on recheck had improved.  We will continue with her current medications.  She is to see her PCP on 11/16, I recommended rechecking her CBC as her hemoglobin has dropped after her procedure.  I will see her back in 3 months, but encouraged her to contact me sooner if needed.   *I have discussed this case with Dr. Einar Gip and he personally examined the patient and participated in formulating the plan.*   Miquel Dunn, MSN, APRN, FNP-C [facility]. [place] Office: [phone] Fax: [phone]

## 2018-12-18 ENCOUNTER — Ambulatory Visit: Payer: Medicare Other | Admitting: Cardiology

## 2019-03-08 ENCOUNTER — Other Ambulatory Visit: Payer: Self-pay

## 2019-03-08 ENCOUNTER — Encounter: Payer: Self-pay | Admitting: Cardiology

## 2019-03-08 ENCOUNTER — Ambulatory Visit (INDEPENDENT_AMBULATORY_CARE_PROVIDER_SITE_OTHER): Payer: Medicare Other | Admitting: Cardiology

## 2019-03-08 VITALS — BP 142/72 | HR 64 | Temp 96.2°F | Resp 16 | Ht 63.0 in | Wt 131.2 lb

## 2019-03-08 DIAGNOSIS — I119 Hypertensive heart disease without heart failure: Secondary | ICD-10-CM

## 2019-03-08 DIAGNOSIS — E7800 Pure hypercholesterolemia, unspecified: Secondary | ICD-10-CM

## 2019-03-08 DIAGNOSIS — I739 Peripheral vascular disease, unspecified: Secondary | ICD-10-CM

## 2019-03-08 DIAGNOSIS — R0989 Other specified symptoms and signs involving the circulatory and respiratory systems: Secondary | ICD-10-CM | POA: Diagnosis not present

## 2019-03-08 NOTE — Progress Notes (Signed)
Primary Physician:  Clayborn Heron, MD   Patient ID: Angela Benitez, female    DOB: [birth year], 86 y.o.   MRN: [id number]  Subjective:    Chief Complaint  Patient presents with  . PAD    3 month follow up    HPI: Angela Benitez  is a 86 y.o. female  with hypertension, diabetes mellitus type 2 and hypercholesterolemia and PAD presents for post procedure f/u.  Due to severe symptoms of claudication, underwent peripheral arteriogram on 10/27/2018 with angioplasty to right SFA and in a staged fashion angioplasty to left SFA on 11/24/2018.  Complex procedure especially the left with severe diffuse disease, patient also developed large hematoma needing hospitalization no blood transfusion.  She did not have any pseudoaneurysm.  ABI had improved.    She reports over the last 3 months, she is doing well. She has noticed significant improvement in claudication symptoms and can walk further distances. No ulcerations. Blood pressure has been well controlled at home. She is scheduled to see her PCP next month. Tolerating medications well.   Past Medical History:  Diagnosis Date  . Claudication in peripheral vascular disease (HCC) 10/27/2018  . Diabetes mellitus without complication (HCC)   . Hyperlipidemia   . Hypertension   . Mild mitral regurgitation   . Murmur   . Neuropathy   . PAD (peripheral artery disease) (HCC)     Past Surgical History:  Procedure Laterality Date  . ABDOMINAL AORTOGRAM N/A 10/27/2018   Procedure: ABDOMINAL AORTOGRAM;  Surgeon: Elder Negus, MD;  Location: [facility];  Service: Cardiovascular;  Laterality: N/A;  . LOWER EXTREMITY ANGIOGRAPHY Bilateral 10/27/2018   Procedure: LOWER EXTREMITY ANGIOGRAPHY;  Surgeon: Elder Negus, MD;  Location: [facility];  Service: Cardiovascular;  Laterality: Bilateral;  . LOWER EXTREMITY ANGIOGRAPHY Right 11/24/2018   Procedure: LOWER EXTREMITY ANGIOGRAPHY;  Surgeon: Elder Negus, MD;   Location: [facility];  Service: Cardiovascular;  Laterality: Right;  . PERIPHERAL VASCULAR ATHERECTOMY Left 10/27/2018   Procedure: PERIPHERAL VASCULAR ATHERECTOMY;  Surgeon: Elder Negus, MD;  Location: [facility];  Service: Cardiovascular;  Laterality: Left;  SFA with DRUG COATED BALLOON  . PERIPHERAL VASCULAR ATHERECTOMY Right 11/24/2018   Procedure: PERIPHERAL VASCULAR ATHERECTOMY;  Surgeon: Elder Negus, MD;  Location: [facility];  Service: Cardiovascular;  Laterality: Right;  fem/pop  . PERIPHERAL VASCULAR BALLOON ANGIOPLASTY Right 11/24/2018   Procedure: PERIPHERAL VASCULAR BALLOON ANGIOPLASTY;  Surgeon: Elder Negus, MD;  Location: [facility];  Service: Cardiovascular;  Laterality: Right;  fem/pop peroneal    Social History   Socioeconomic History  . Marital status: Widowed    Spouse name: Not on file  . Number of children: 3  . Years of education: Not on file  . Highest education level: Not on file  Occupational History  . Not on file  Tobacco Use  . Smoking status: Never Smoker  . Smokeless tobacco: Never Used  Substance and Sexual Activity  . Alcohol use: No  . Drug use: Never  . Sexual activity: Not on file  Other Topics Concern  . Not on file  Social History Narrative  . Not on file   Social Determinants of Health   Financial Resource Strain:   . Difficulty of Paying Living Expenses: Not on file  Food Insecurity:   . Worried About Programme researcher, broadcasting/film/video in the Last Year: Not on file  . Ran Out  of Food in the Last Year: Not on file  Transportation Needs:   . Lack of Transportation (Medical): Not on file  . Lack of Transportation (Non-Medical): Not on file  Physical Activity:   . Days of Exercise per Week: Not on file  . Minutes of Exercise per Session: Not on file  Stress:   . Feeling of Stress : Not on file  Social Connections:   . Frequency of Communication with Friends and Family: Not on file  .  Frequency of Social Gatherings with Friends and Family: Not on file  . Attends Religious Services: Not on file  . Active Member of Clubs or Organizations: Not on file  . Attends Archivist Meetings: Not on file  . Marital Status: Not on file  Intimate Partner Violence:   . Fear of Current or Ex-Partner: Not on file  . Emotionally Abused: Not on file  . Physically Abused: Not on file  . Sexually Abused: Not on file    Review of Systems  Constitution: Negative for decreased appetite, malaise/fatigue, weight gain and weight loss.  Eyes: Negative for visual disturbance.  Cardiovascular: Positive for claudication (significantly improved; very mild in bilateral calves). Negative for chest pain, dyspnea on exertion, leg swelling, orthopnea, palpitations and syncope.  Respiratory: Negative for hemoptysis and wheezing.   Endocrine: Negative for cold intolerance and heat intolerance.  Hematologic/Lymphatic: Does not bruise/bleed easily.  Skin: Negative for nail changes.  Musculoskeletal: Negative for muscle weakness and myalgias.  Gastrointestinal: Negative for abdominal pain, change in bowel habit, nausea and vomiting.  Neurological: Negative for difficulty with concentration, dizziness, focal weakness and headaches.  Psychiatric/Behavioral: Negative for altered mental status and suicidal ideas.  All other systems reviewed and are negative.     Objective:   Vitals with BMI 03/08/2019 03/08/2019 12/04/2018  Height - 5\' 3"  5\' 3"   Weight - 131 lbs 3 oz 135 lbs 14 oz  BMI - 50.35 46.56  Systolic 812 751 700  Diastolic 72 64 65  Pulse - 64 75      Physical Exam  Constitutional: She is oriented to person, place, and time. Vital signs are normal. She appears well-developed and well-nourished.  HENT:  Head: Normocephalic and atraumatic.  Cardiovascular: Normal rate, regular rhythm and intact distal pulses.  Murmur heard.  Early systolic murmur is present with a grade of 1/6 at the  upper right sternal border. Pulses:      Carotid pulses are on the left side with bruit.      Femoral pulses are 1+ on the right side and 1+ on the left side.      Popliteal pulses are 0 on the right side and 0 on the left side.       Dorsalis pedis pulses are 1+ on the right side and 0 on the left side.       Posterior tibial pulses are 0 on the right side and 0 on the left side.  Pulmonary/Chest: Effort normal and breath sounds normal. No accessory muscle usage. No respiratory distress.  Abdominal: Soft. Bowel sounds are normal.  Musculoskeletal:        General: Normal range of motion.     Cervical back: Normal range of motion.  Neurological: She is alert and oriented to person, place, and time.  Skin: Skin is warm and dry.  Vitals reviewed.  Radiology: No results found.  Laboratory examination:    CMP Latest Ref Rng & Units 11/24/2018 11/24/2018 11/11/2018  Glucose 70 -  99 mg/dL 858(I) 502(D) 741(O)  BUN 8 - 23 mg/dL 20 87(O) 21  Creatinine 0.44 - 1.00 mg/dL 6.76 7.20 9.47(S)  Sodium 135 - 145 mmol/L 140 139 143  Potassium 3.5 - 5.1 mmol/L 3.8 3.5 4.3  Chloride 98 - 111 mmol/L 106 104 101  CO2 20 - 29 mmol/L - - 26  Calcium 8.7 - 10.3 mg/dL - - 10.7(H)   CBC Latest Ref Rng & Units 11/24/2018 11/24/2018 11/24/2018  WBC 3.4 - 10.8 x10E3/uL - - -  Hemoglobin 12.0 - 15.0 g/dL 9.6(G) 10.0(L) 9.4(L)  Hematocrit 36.0 - 46.0 % 28.5(L) 31.3(L) 29.8(L)  Platelets 150 - 450 x10E3/uL - - -   Lipid Panel  No results found for: CHOL, TRIG, HDL, CHOLHDL, VLDL, LDLCALC, LDLDIRECT HEMOGLOBIN A1C No results found for: HGBA1C, MPG TSH No results for input(s): TSH in the last 8760 hours.  PRN Meds:. Medications Discontinued During This Encounter  Medication Reason  . acetaminophen (TYLENOL) 500 MG tablet Error  . gabapentin (NEURONTIN) 100 MG capsule Error   Current Meds  Medication Sig  . aspirin EC 81 MG tablet Take 81 mg by mouth daily.  . clopidogrel (PLAVIX) 75 MG tablet  Take 1 tablet (75 mg total) by mouth daily.  . fosinopril (MONOPRIL) 40 MG tablet Take 40 mg by mouth daily.   Marland Kitchen glimepiride (AMARYL) 2 MG tablet Take 2-3 mg by mouth See admin instructions. Take 1.5 tablets (3 mg) at breakfast and 1 tablet (2mg ) at bedtime  . hydrochlorothiazide (HYDRODIURIL) 25 MG tablet Take 25 mg by mouth daily.   latanoprost (XALATAN) 0.005 % ophthalmic solution Place 1 drop into both eyes at bedtime.   . metFORMIN (GLUCOPHAGE) 1000 MG tablet Take 1 tablet (1,000 mg total) by mouth 2 (two) times daily with a meal.  . metoprolol tartrate (LOPRESSOR) 50 MG tablet Take 50 mg by mouth daily.   . pravastatin (PRAVACHOL) 20 MG tablet Take 20 mg by mouth daily.   . [DISCONTINUED] gabapentin (NEURONTIN) 100 MG capsule Take 200 mg by mouth at bedtime.     Cardiac Studies:   Echocardiogram 05/02/2016: Left ventricle cavity is small. Cavitary obliteration noted and suspect intraventricular PG. Mild concentric remodeling of the left ventricle. Basal septal hypertrophy. Normal global wall motion. Doppler evidence of grade II (pseudonormal) diastolic dysfunction, elevated LAP. Calculated EF 74%. Mild (Grade I) mitral regurgitation. No evidence of mitral valve stenosis. Moderate tricuspid regurgitation. Mild pulmonary hypertension. Pulmonary artery systolic pressure is estimated at 33 mm Hg. Mild pulmonic regurgitation.  Peripheral arteriogram 10/27/2018:  Right SFA tandem 95% mid stenosis, 1%) atrial artery occlusion.  One-vessel runoff in the form of right anterior tibial.  Successful PTCA with 5.0 X 200 mm In.Pact DCB left distal superficial femoral artery/proximal left poppliteal artery and 5.0 X 60 mm In.Pact DCB left proximal superficial femoral artery      11/24/2018: Diffuse disease in the left SFA from 60-95% stenosis, diffuse disease and popliteal vessel up to 40%, occluded left DP trunk, patent left anterior tibial with distal 100% occlusion at the ankle. Right SFA: Tandem  95% stenoses right mid SFA Subtotally occluded rt popliteal artery/ Rt TP trunk, with reconstitution of all three vessels below the knee with diffuse distal disease. Atherectomy (1.5 solid [place] Diamonback) Rt mid to distal SFA, Rt popliteal artery, Rt TP trunk PTA 3.0 X 200 mm balloon Rt common peroneal artery to Rt mid SFA PTA 5.0 X 150 mm balloon Rt mid to distal SFA 10% residual stenosis  ABI 11/25/2018: Right: Resting right ankle-brachial index indicates noncompressible right lower extremity arteries. The right toe-brachial index is abnormal. Non compressible and ABIs are unreliable. Monophasic ABI 1.15. snd TBI 0.25   Left: Resting left ankle-brachial index indicates mild left lower extremity arterial disease. The left toe-brachial index is abnormal. Monophasic ABI 0.86 and TBI 0.25 Compared to 09/16/18, Right ABI 0.40 and Left ABI 0.60.  Lower extremity arterial duplex 11/25/2018: Bilateral: No evidence of pseudoaneurysm, arteriovenous fistula, or DVT involving bilateral common femoral veins. There area areas of vascularization extending superficially from the CFA in bilateral groins with monophasic flow; suggestive of possibly  vascularized needle tract.  Assessment:     ICD-10-CM   1. PAD (peripheral artery disease) (HCC)  I73.9 PCV LOWER ARTERIAL (BILATERAL)  2. Hypertension with heart disease  I11.9   3. Hypercholesterolemia  E78.00   4. Left carotid bruit  R09.89 PCV CAROTID DUPLEX (BILATERAL)    EKG 07/13/2018: Sinus rhythm, within normal limits.  Recommendations:   Patient is doing well since last seen 3 months ago, she has had significant improvement in claudication symptoms since her procedure to now only mild bilateral calves claudication. She can now walk further distances. She has had slight changes to her vascular exam. No evidence of ischemic limb. Will recommend repeat lower extremity duplex for continued surveillance as has been almost 6 months since her procedure.  She continues to be on DAPT, will continue. I do not have recent labs from PCP, She previously had drop in her hemoglobin after the procedure. I would recommend repeating CBC if not already performed. She denies any bleeding.  Blood pressure was initially elevated in our office, but on recheck had improved. No changes were made as her blood pressure has been well controlled at home. She does have left carotid bruit on exam, will obtain carotid duplex for further evaluation. She is on pravastatin for hyperlipidemia, will obtain lipid results from PCP office for further evaluation. Will see her back in 3 months for follow up on PAD.    Toniann Fail, MSN, APRN, FNP-C [facility] [place] Office: [phone] Fax: [phone]

## 2019-04-05 ENCOUNTER — Ambulatory Visit: Payer: Medicare Other

## 2019-04-05 ENCOUNTER — Other Ambulatory Visit: Payer: Self-pay

## 2019-04-05 DIAGNOSIS — R0989 Other specified symptoms and signs involving the circulatory and respiratory systems: Secondary | ICD-10-CM | POA: Diagnosis not present

## 2019-04-05 DIAGNOSIS — I6523 Occlusion and stenosis of bilateral carotid arteries: Secondary | ICD-10-CM

## 2019-04-05 DIAGNOSIS — I739 Peripheral vascular disease, unspecified: Secondary | ICD-10-CM

## 2019-04-11 ENCOUNTER — Other Ambulatory Visit: Payer: Self-pay | Admitting: Cardiology

## 2019-04-11 DIAGNOSIS — I6523 Occlusion and stenosis of bilateral carotid arteries: Secondary | ICD-10-CM

## 2019-04-15 ENCOUNTER — Ambulatory Visit: Payer: Medicare Other | Admitting: Cardiology

## 2019-04-15 ENCOUNTER — Encounter: Payer: Self-pay | Admitting: Cardiology

## 2019-04-15 ENCOUNTER — Other Ambulatory Visit: Payer: Self-pay

## 2019-04-15 VITALS — BP 167/53 | HR 62 | Temp 95.5°F | Resp 16 | Ht 63.0 in | Wt 133.7 lb

## 2019-04-15 DIAGNOSIS — L97511 Non-pressure chronic ulcer of other part of right foot limited to breakdown of skin: Secondary | ICD-10-CM

## 2019-04-15 DIAGNOSIS — I998 Other disorder of circulatory system: Secondary | ICD-10-CM | POA: Diagnosis not present

## 2019-04-15 DIAGNOSIS — I739 Peripheral vascular disease, unspecified: Secondary | ICD-10-CM

## 2019-04-15 DIAGNOSIS — I6523 Occlusion and stenosis of bilateral carotid arteries: Secondary | ICD-10-CM | POA: Diagnosis not present

## 2019-04-15 DIAGNOSIS — I10 Essential (primary) hypertension: Secondary | ICD-10-CM

## 2019-04-15 DIAGNOSIS — I70229 Atherosclerosis of native arteries of extremities with rest pain, unspecified extremity: Secondary | ICD-10-CM

## 2019-04-15 DIAGNOSIS — Z959 Presence of cardiac and vascular implant and graft, unspecified: Secondary | ICD-10-CM | POA: Diagnosis not present

## 2019-04-15 MED ORDER — RIVAROXABAN 2.5 MG PO TABS: 2.5000 mg | ORAL_TABLET | Freq: Two times a day (BID) | ORAL | 0 refills | Status: DC

## 2019-04-15 NOTE — Progress Notes (Signed)
Primary Physician:  Clayborn Heron, MD   Patient ID: Angela Benitez, female    DOB: 11-04-1933, 86 y.o.   MRN: [id number]  Subjective:    Chief Complaint  Patient presents with  . PAD  . Follow-up    results    HPI: [name]  is a 86 y.o. female  with hypertension, diabetes mellitus type 2 and hypercholesterolemia and PAD presents for post procedure f/u.  Due to severe symptoms of claudication, underwent peripheral arteriogram on 10/27/2018 with angioplasty to right SFA and in a staged fashion angioplasty to left SFA on 11/24/2018.  Complex procedure especially the left with severe diffuse disease, patient also developed large hematoma needing hospitalization no blood transfusion.  ABI had improved.    2 weeks ago she developed small ulceration involving the right foot and also pain in bilateral foot right worse than left especially at night.  She has had disturbed sleep due to this.  She underwent lower extremity artery duplex, which is markedly abnormal hence would not have him on a urgent basis to evaluate.   Past Medical History:  Diagnosis Date  . Claudication in peripheral vascular disease (HCC) 10/27/2018  . Diabetes mellitus without complication (HCC)   . Hyperlipidemia   . Hypertension   . Mild mitral regurgitation   . Murmur   . Neuropathy   . PAD (peripheral artery disease) (HCC)     Past Surgical History:  Procedure Laterality Date  . ABDOMINAL AORTOGRAM N/A 10/27/2018   Procedure: ABDOMINAL AORTOGRAM;  Surgeon: Elder Negus, MD;  Location: [facility];  Service: Cardiovascular;  Laterality: N/A;  . LOWER EXTREMITY ANGIOGRAPHY Bilateral 10/27/2018   Procedure: LOWER EXTREMITY ANGIOGRAPHY;  Surgeon: Elder Negus, MD;  Location: [facility];  Service: Cardiovascular;  Laterality: Bilateral;  . LOWER EXTREMITY ANGIOGRAPHY Right 11/24/2018   Procedure: LOWER EXTREMITY ANGIOGRAPHY;  Surgeon: Elder Negus, MD;  Location: [facility];  Service: Cardiovascular;  Laterality: Right;  . PERIPHERAL VASCULAR ATHERECTOMY Left 10/27/2018   Procedure: PERIPHERAL VASCULAR ATHERECTOMY;  Surgeon: Elder Negus, MD;  Location: [facility];  Service: Cardiovascular;  Laterality: Left;  SFA with DRUG COATED BALLOON  . PERIPHERAL VASCULAR ATHERECTOMY Right 11/24/2018   Procedure: PERIPHERAL VASCULAR ATHERECTOMY;  Surgeon: Elder Negus, MD;  Location: [facility];  Service: Cardiovascular;  Laterality: Right;  fem/pop  . PERIPHERAL VASCULAR BALLOON ANGIOPLASTY Right 11/24/2018   Procedure: PERIPHERAL VASCULAR BALLOON ANGIOPLASTY;  Surgeon: Elder Negus, MD;  Location: [facility];  Service: Cardiovascular;  Laterality: Right;  fem/pop peroneal    Social History   Socioeconomic History  . Marital status: Widowed    Spouse name: Not on file  . Number of children: 3  . Years of education: Not on file  . Highest education level: Not on file  Occupational History  . Not on file  Tobacco Use  . Smoking status: Never Smoker  . Smokeless tobacco: Never Used  Substance and Sexual Activity  . Alcohol use: No  . Drug use: Never  . Sexual activity: Not on file  Other Topics Concern  . Not on file  Social History Narrative  . Not on file   Social Determinants of Health   Financial Resource Strain:   . Difficulty of Paying Living Expenses:   Food Insecurity:   . Worried About Programme researcher, broadcasting/film/video in the Last Year:   . [employer]  in the Last Year:   Transportation Needs:   . Freight forwarder (Medical):   Marland Kitchen Lack of Transportation (Non-Medical):   Physical Activity:   . Days of Exercise per Week:   . Minutes of Exercise per Session:   Stress:   . Feeling of Stress :   Social Connections:   . Frequency of Communication with Friends and Family:   . Frequency of Social Gatherings with Friends and Family:   . Attends Religious Services:   . Active Member of Clubs  or Organizations:   . Attends Banker Meetings:   Marland Kitchen Marital Status:   Intimate Partner Violence:   . Fear of Current or Ex-Partner:   . Emotionally Abused:   Marland Kitchen Physically Abused:   . Sexually Abused:     Review of Systems  Cardiovascular: Positive for claudication. Negative for chest pain, dyspnea on exertion and leg swelling.  Gastrointestinal: Negative for melena.      Objective:   Vitals with BMI 04/15/2019 03/08/2019 03/08/2019  Height 5\' 3"  - 5\' 3"   Weight 133 lbs 11 oz - 131 lbs 3 oz  BMI 23.69 - 23.25  Systolic 167 142  Diastolic 53 72 64  Pulse 62 - 64      Physical Exam  Constitutional: Vital signs are normal. She appears well-developed and well-nourished.  Cardiovascular: Normal rate, regular rhythm and intact distal pulses.  Murmur heard.  Early systolic murmur is present with a grade of 1/6 at the upper right sternal border. Pulses:      Carotid pulses are on the left side with bruit.      Femoral pulses are 1+ on the right side and 2+ on the left side.      Popliteal pulses are 0 on the right side and 0 on the left side.       Dorsalis pedis pulses are 0 on the right side and 0 on the left side.       Posterior tibial pulses are 0 on the right side and 0 on the left side.  Bilateral feet ischemic appearing. Right foot superficial ulceration limited to skin. Purplish feet bilateral  Pulmonary/Chest: Effort normal and breath sounds normal. No accessory muscle usage. No respiratory distress.  Abdominal: Soft. Bowel sounds are normal.  Vitals reviewed.     Radiology: No results found.  Laboratory examination:    CMP Latest Ref Rng & Units 11/24/2018 11/24/2018 11/11/2018  Glucose 70 - 99 mg/dL 11/26/2018) 11/13/2018) 295(J)  BUN 8 - 23 mg/dL 20 884(Z) 21  Creatinine 0.44 - 1.00 mg/dL 660(Y 30(Z 6.01)  Sodium 135 - 145 mmol/L 140 139 143  Potassium 3.5 - 5.1 mmol/L 3.8 3.5 4.3  Chloride 98 - 111 mmol/L 106 104 101  CO2 20 - 29 mmol/L - - 26  Calcium  8.7 - 10.3 mg/dL - - 10.7(H)   CBC Latest Ref Rng & Units 11/24/2018 11/24/2018 11/24/2018  WBC 3.4 - 10.8 x10E3/uL - - -  Hemoglobin 12.0 - 15.0 g/dL 11/26/2018) 10.0(L) 9.4(L)  Hematocrit 36.0 - 46.0 % 28.5(L) 31.3(L) 29.8(L)  Platelets 150 - 450 x10E3/uL - - -   Lipid Panel  No results found for: CHOL, TRIG, HDL, CHOLHDL, VLDL, LDLCALC, LDLDIRECT HEMOGLOBIN A1C No results found for: HGBA1C, MPG TSH No results for input(s): TSH in the last 8760 hours.  PRN Meds:. There are no discontinued medications. Current Meds  Medication Sig  . aspirin EC 81 MG tablet Take 81 mg by mouth daily.  11/26/2018  clopidogrel (PLAVIX) 75 MG tablet Take 1 tablet (75 mg total) by mouth daily.  . fosinopril (MONOPRIL) 40 MG tablet Take 40 mg by mouth daily.   Marland Kitchen glimepiride (AMARYL) 2 MG tablet Take 2-3 mg by mouth See admin instructions. Take 1.5 tablets (3 mg) at breakfast and 1 tablet (2mg ) at bedtime  . hydrochlorothiazide (HYDRODIURIL) 25 MG tablet Take 25 mg by mouth daily.   Marland Kitchen latanoprost (XALATAN) 0.005 % ophthalmic solution Place 1 drop into both eyes at bedtime.   . metFORMIN (GLUCOPHAGE) 1000 MG tablet Take 1 tablet (1,000 mg total) by mouth 2 (two) times daily with a meal.  . metoprolol tartrate (LOPRESSOR) 50 MG tablet Take 50 mg by mouth daily.   . pravastatin (PRAVACHOL) 20 MG tablet Take 20 mg by mouth daily.     Cardiac Studies:   Echocardiogram 05/02/2016: Left ventricle cavity is small. Cavitary obliteration noted and suspect intraventricular PG. Mild concentric remodeling of the left ventricle. Basal septal hypertrophy. Normal global wall motion. Doppler evidence of grade II (pseudonormal) diastolic dysfunction, elevated LAP. Calculated EF 74%. Mild (Grade I) mitral regurgitation. No evidence of mitral valve stenosis. Moderate tricuspid regurgitation. Mild pulmonary hypertension. Pulmonary artery systolic pressure is estimated at 33 mm Hg. Mild pulmonic regurgitation.  Peripheral arteriogram  10/27/2018:  Right SFA tandem 95% mid stenosis, 1%) atrial artery occlusion.  One-vessel runoff in the form of right anterior tibial.  Successful PTCA with 5.0 X 200 mm In.Pact DCB left distal superficial femoral artery/proximal left poppliteal artery and 5.0 X 60 mm In.Pact DCB left proximal superficial femoral artery      11/24/2018: Diffuse disease in the left SFA from 60-95% stenosis, diffuse disease and popliteal vessel up to 40%, occluded left DP trunk, patent left anterior tibial with distal 100% occlusion at the ankle. Right SFA: Tandem 95% stenoses right mid SFA Subtotally occluded rt popliteal artery/ Rt TP trunk, with reconstitution of all three vessels below the knee with diffuse distal disease. Atherectomy (1.5 solid [place] Diamonback) Rt mid to distal SFA, Rt popliteal artery, Rt TP trunk PTA 3.0 X 200 mm balloon Rt common peroneal artery to Rt mid SFA PTA 5.0 X 150 mm balloon Rt mid to distal SFA 10% residual stenosis  ABI 11/25/2018: Right: Resting right ankle-brachial index indicates noncompressible right lower extremity arteries. The right toe-brachial index is abnormal. Non compressible and ABIs are unreliable. Monophasic ABI 1.15. snd TBI 0.25   Left: Resting left ankle-brachial index indicates mild left lower extremity arterial disease. The left toe-brachial index is abnormal. Monophasic ABI 0.86 and TBI 0.25 Compared to 09/16/18, Right ABI 0.40 and Left ABI 0.60.  Lower Extremity Arterial Duplex 04/05/2019:  Monophasic waveform throughout the right SFA suggests significant (>50%  right proximal SFA stenosis).  Monophasic waveform below the left knee suggests diffuse small vessel  disease below the left knee.  This exam reveals critically decreased perfusion of the right lower  extremity, noted at the anterior tibial artery level (ABI 0.40) and  moderately decreased perfusion of the left lower extremity, noted at the  post tibial artery level (ABI 0.90).  Compared to  09/16/2018, bilateral  critical decrease in ABI, right 0.40 and left 0.60 is unchanged. However post procedure ABI on 11/25/2018 was 1.15 right and 0.86 left. Consider further work up including angiography.  EKG:  EKG 04/15/2019: Normal sinus rhythm with rate of 66 bpm, normal axis.  Single PAC, otherwise normal EKG.    Assessment:     ICD-10-CM   1.  Critical limb ischemia with history of revascularization of same extremity  I99.8 Basic metabolic panel   C16.3 CBC    rivaroxaban (XARELTO) 2.5 MG TABS tablet  2. PAD (peripheral artery disease) (HCC)  I73.9   3. Ulcer of right foot, limited to breakdown of skin (HCC)  L97.511   4. Primary hypertension  I10 EKG 12-Lead    EKG 07/13/2018: Sinus rhythm, within normal limits.  Recommendations:   Angela Benitez  is a 86 y.o. female  with hypertension, diabetes mellitus type 2 and hypercholesterolemia and PAD presents for post procedure f/u.  Due to severe symptoms of claudication, underwent peripheral arteriogram on 10/27/2018 with angioplasty to right SFA and in a staged fashion angioplasty to left SFA on 11/24/2018.  Complex procedure especially the left with severe diffuse disease, patient also developed large hematoma needing hospitalization no blood transfusion.  ABI had improved.    We have an arterial as recent lower extremity arterial duplex revealed marked abnormality with critical limb ischemia again. We wanted to discuss proceeding with angiography, patient although 86 years of age is fairly active.  She has not had up critical limb ischemia with ulceration limited dorsal right foot that started 2 weeks ago.  She also has ischemic appearing toes bilaterally.  We will try to get peripheral arteriogram set up ASAP.  There is no evidence of infection, the leg ulcer is very very superficial.  I started on Xarelto 2.5 mg p.o. twice daily, patient is presently on aspirin and Plavix, will use triple therapy for now until angiography is performed  and then decide upon the strategy.  Blood pressure is elevated today but I did not address this as I would like to have fluctuating.  I did not make any changes to her medication otherwise.  Office visit after the procedure, patient and her daughter are aware of risks associated with proceeding with angiography including but not limited to bleeding diathesis, renal failure specifically and not limited to this.  They are also aware that she has critical limb ischemia and potential for limb loss.  Yates Decamp, MD, [facility] 04/15/2019, 12:31 PM [facility] [place] Office: [phone]

## 2019-04-15 NOTE — Patient Instructions (Signed)
Endovascular Therapy for Peripheral Arterial Disease Peripheral arterial disease (PAD) is a condition in which the arteries that supply blood to the arms and legs (limbs) are too narrow. The narrowing is due to plaque buildup (atherosclerosis). Plaque is made up of fat, cholesterol, calcium, or other substances that can build up inside blood vessels. PAD can cause pain and numbness due to lack of blood flow, particularly in the legs. Endovascular therapy is a procedure to widen a narrowed blood vessel and improve blood flow. Endovascular means the procedure is done inside your artery, using a long, thin tube (catheter). The catheter is inserted into an incision in your leg and moved up your artery until it reaches the narrow part. A balloon or a small metal tube (stent) may be used to help widen the narrow artery and keep it open. Your health care provider may recommend endovascular therapy if lifestyle changes and medicines are not enough to improve your PAD. In some cases--such as when more than one artery is affected--you may need more than one procedure. Tell a health care provider about:  Any allergies you have.  All medicines you are taking, including vitamins, herbs, eye drops, creams, and over-the-counter medicines.  Any problems you or family members have had with anesthetic medicines.  Any blood disorders you have.  Any surgeries you have had.  Any medical conditions you have.  Whether you are pregnant or may be pregnant. What are the risks? Generally, this is a safe procedure. However, problems may occur, including:  Infection.  Bleeding.  Allergic reactions to medicines, materials, or dyes.  Damage to other structures or organs.  Heart attack.  Stroke.  Blood clots.  Kidney problems.  Nerve damage.  The stent moving out of place, becoming blocked, or not working.  Loss of your affected arm or leg. What happens before the procedure? Medicines  Ask your health  care provider about: ? Changing or stopping your regular medicines. This is especially important if you are taking diabetes medicines or blood thinners. ? Taking medicines such as aspirin and ibuprofen. These medicines can thin your blood. Do not take these medicines unless your health care provider tells you to take them. ? Taking over-the-counter medicines, vitamins, herbs, and supplements.  You may be given antibiotic medicine to help prevent infection. General instructions  Do not use any products that contain nicotine or tobacco, such as cigarettes and e-cigarettes. If you need help quitting, ask your health care provider.  Follow instructions from your health care provider about eating or drinking restrictions.  You will have blood tests and a physical exam. You may have other tests, such as: ? Ankle-brachial index (ABI). This test compares blood pressure in your ankle and arm. This can indicate narrowing or blockage in your leg arteries. ? Doppler ultrasound. This test uses sound waves to check blood flow. ? CT scan. This test uses dye to check blood flow and blockages in your leg arteries. ? MRI. ? Electrocardiogram (ECG) to check the electrical patterns and rhythms of the heart.  You may be asked to shower with a germ-killing soap.  Ask your health care provider how your surgical site will be marked or identified.  Plan to have someone take you home from the hospital. What happens during the procedure?   To lower your risk of infection: ? Your health care team will wash or sanitize their hands. ? Hair may be removed from the surgical area. ? Your skin will be washed with soap.    An IV will be inserted into one of your veins.  You will be given one or more of the following: ? A medicine to help you relax (sedative). ? A medicine to numb the area for the procedure (local anesthetic).  A puncture will be made in your upper thigh area, in the femoral artery or the iliac  artery. Rarely, a puncture may be made in the ankle area. A small incision may be made instead of a puncture.  A small wire will be inserted into the artery and moved through the artery.  Dye will be injected into your artery, and X-rays will be used to help identify where the blockage is. The dye helps to make the blood flow visible on X-rays.  A catheter will be inserted into the same spot as the small wire. It will be moved up the artery to reach the blocked or narrow part.  A small, deflated balloon will be inserted over the wire and into the catheter. It will be moved up the artery to reach the blocked or narrow part.  The small balloon will be filled with air (inflated) to widen the narrow part of the artery.  The balloon will be deflated.  A stent may be placed in the widened part of the artery to keep the artery open.  The wire and catheter will be removed.  A small catheter (urinary catheter) may be placed to drain urine from your bladder. This catheter may be used temporarily to drain urine during or after the procedure.  Your puncture or incision may be closed with a stitch (suture) or skin glue.  Your puncture or incision may be covered with a bandage (dressing). The procedure may vary among health care providers and hospitals. What happens after the procedure?  Your blood pressure, heart rate, breathing rate, and blood oxygen level will be monitored until the medicines you were given have worn off.  You will need to stay in bed as directed.  You may continue to have a catheter draining your urine.  You will be encouraged to drink fluids to wash (flush) the dye out of your body.  You will be given pain medicine as needed.  If you were given a sedative, do not drive for 24 hours or until your health care provider says that driving is safe for you. Summary  Endovascular therapy is a procedure to widen a narrowed blood vessel and improve blood flow.  This procedure  may be recommended if lifestyle changes and medicines are not enough to improve your peripheral arterial disease (PAD).  After the procedure, you will need to stay in bed and you will be encouraged to drink fluids to wash (flush) dye out of your body. This information is not intended to replace advice given to you by your health care provider. Make sure you discuss any questions you have with your health care provider. Document Revised: 01/09/2018 Document Reviewed: 05/09/2016 Elsevier Patient Education  2020 [employer]  

## 2019-04-15 NOTE — H&P (View-Only) (Signed)
Primary Physician:  Clayborn Heron, MD   Patient ID: Angela Benitez, female    DOB: 11-04-1933, 86 y.o.   MRN: [id number]  Subjective:    Chief Complaint  Patient presents with  . PAD  . Follow-up    results    HPI: Angela Benitez  is a 86 y.o. female  with hypertension, diabetes mellitus type 2 and hypercholesterolemia and PAD presents for post procedure f/u.  Due to severe symptoms of claudication, underwent peripheral arteriogram on 10/27/2018 with angioplasty to right SFA and in a staged fashion angioplasty to left SFA on 11/24/2018.  Complex procedure especially the left with severe diffuse disease, patient also developed large hematoma needing hospitalization no blood transfusion.  ABI had improved.    2 weeks ago she developed small ulceration involving the right foot and also pain in bilateral foot right worse than left especially at night.  She has had disturbed sleep due to this.  She underwent lower extremity artery duplex, which is markedly abnormal hence would not have him on a urgent basis to evaluate.   Past Medical History:  Diagnosis Date  . Claudication in peripheral vascular disease (HCC) 10/27/2018  . Diabetes mellitus without complication (HCC)   . Hyperlipidemia   . Hypertension   . Mild mitral regurgitation   . Murmur   . Neuropathy   . PAD (peripheral artery disease) (HCC)     Past Surgical History:  Procedure Laterality Date  . ABDOMINAL AORTOGRAM N/A 10/27/2018   Procedure: ABDOMINAL AORTOGRAM;  Surgeon: Elder Negus, MD;  Location: [facility];  Service: Cardiovascular;  Laterality: N/A;  . LOWER EXTREMITY ANGIOGRAPHY Bilateral 10/27/2018   Procedure: LOWER EXTREMITY ANGIOGRAPHY;  Surgeon: Elder Negus, MD;  Location: [facility];  Service: Cardiovascular;  Laterality: Bilateral;  . LOWER EXTREMITY ANGIOGRAPHY Right 11/24/2018   Procedure: LOWER EXTREMITY ANGIOGRAPHY;  Surgeon: Elder Negus, MD;  Location: [facility];  Service: Cardiovascular;  Laterality: Right;  . PERIPHERAL VASCULAR ATHERECTOMY Left 10/27/2018   Procedure: PERIPHERAL VASCULAR ATHERECTOMY;  Surgeon: Elder Negus, MD;  Location: [facility];  Service: Cardiovascular;  Laterality: Left;  SFA with DRUG COATED BALLOON  . PERIPHERAL VASCULAR ATHERECTOMY Right 11/24/2018   Procedure: PERIPHERAL VASCULAR ATHERECTOMY;  Surgeon: Elder Negus, MD;  Location: [facility];  Service: Cardiovascular;  Laterality: Right;  fem/pop  . PERIPHERAL VASCULAR BALLOON ANGIOPLASTY Right 11/24/2018   Procedure: PERIPHERAL VASCULAR BALLOON ANGIOPLASTY;  Surgeon: Elder Negus, MD;  Location: [facility];  Service: Cardiovascular;  Laterality: Right;  fem/pop peroneal    Social History   Socioeconomic History  . Marital status: Widowed    Spouse name: Not on file  . Number of children: 3  . Years of education: Not on file  . Highest education level: Not on file  Occupational History  . Not on file  Tobacco Use  . Smoking status: Never Smoker  . Smokeless tobacco: Never Used  Substance and Sexual Activity  . Alcohol use: No  . Drug use: Never  . Sexual activity: Not on file  Other Topics Concern  . Not on file  Social History Narrative  . Not on file   Social Determinants of Health   Financial Resource Strain:   . Difficulty of Paying Living Expenses:   Food Insecurity:   . Worried About Programme researcher, broadcasting/film/video in the Last Year:   . [employer]  in the Last Year:   Transportation Needs:   . Freight forwarder (Medical):   Marland Kitchen Lack of Transportation (Non-Medical):   Physical Activity:   . Days of Exercise per Week:   . Minutes of Exercise per Session:   Stress:   . Feeling of Stress :   Social Connections:   . Frequency of Communication with Friends and Family:   . Frequency of Social Gatherings with Friends and Family:   . Attends Religious Services:   . Active Member of Clubs  or Organizations:   . Attends Banker Meetings:   Marland Kitchen Marital Status:   Intimate Partner Violence:   . Fear of Current or Ex-Partner:   . Emotionally Abused:   Marland Kitchen Physically Abused:   . Sexually Abused:     Review of Systems  Cardiovascular: Positive for claudication. Negative for chest pain, dyspnea on exertion and leg swelling.  Gastrointestinal: Negative for melena.      Objective:   Vitals with BMI 04/15/2019 03/08/2019 03/08/2019  Height 5\' 3"  - 5\' 3"   Weight 133 lbs 11 oz - 131 lbs 3 oz  BMI 23.69 - 23.25  Systolic 167 142  Diastolic 53 72 64  Pulse 62 - 64      Physical Exam  Constitutional: Vital signs are normal. She appears well-developed and well-nourished.  Cardiovascular: Normal rate, regular rhythm and intact distal pulses.  Murmur heard.  Early systolic murmur is present with a grade of 1/6 at the upper right sternal border. Pulses:      Carotid pulses are on the left side with bruit.      Femoral pulses are 1+ on the right side and 2+ on the left side.      Popliteal pulses are 0 on the right side and 0 on the left side.       Dorsalis pedis pulses are 0 on the right side and 0 on the left side.       Posterior tibial pulses are 0 on the right side and 0 on the left side.  Bilateral feet ischemic appearing. Right foot superficial ulceration limited to skin. Purplish feet bilateral  Pulmonary/Chest: Effort normal and breath sounds normal. No accessory muscle usage. No respiratory distress.  Abdominal: Soft. Bowel sounds are normal.  Vitals reviewed.     Radiology: No results found.  Laboratory examination:    CMP Latest Ref Rng & Units 11/24/2018 11/24/2018 11/11/2018  Glucose 70 - 99 mg/dL 11/26/2018) 11/13/2018) 295(J)  BUN 8 - 23 mg/dL 20 884(Z) 21  Creatinine 0.44 - 1.00 mg/dL 660(Y 30(Z 6.01)  Sodium 135 - 145 mmol/L 140 139 143  Potassium 3.5 - 5.1 mmol/L 3.8 3.5 4.3  Chloride 98 - 111 mmol/L 106 104 101  CO2 20 - 29 mmol/L - - 26  Calcium  8.7 - 10.3 mg/dL - - 10.7(H)   CBC Latest Ref Rng & Units 11/24/2018 11/24/2018 11/24/2018  WBC 3.4 - 10.8 x10E3/uL - - -  Hemoglobin 12.0 - 15.0 g/dL 11/26/2018) 10.0(L) 9.4(L)  Hematocrit 36.0 - 46.0 % 28.5(L) 31.3(L) 29.8(L)  Platelets 150 - 450 x10E3/uL - - -   Lipid Panel  No results found for: CHOL, TRIG, HDL, CHOLHDL, VLDL, LDLCALC, LDLDIRECT HEMOGLOBIN A1C No results found for: HGBA1C, MPG TSH No results for input(s): TSH in the last 8760 hours.  PRN Meds:. There are no discontinued medications. Current Meds  Medication Sig  . aspirin EC 81 MG tablet Take 81 mg by mouth daily.  11/26/2018  clopidogrel (PLAVIX) 75 MG tablet Take 1 tablet (75 mg total) by mouth daily.  . fosinopril (MONOPRIL) 40 MG tablet Take 40 mg by mouth daily.   Marland Kitchen glimepiride (AMARYL) 2 MG tablet Take 2-3 mg by mouth See admin instructions. Take 1.5 tablets (3 mg) at breakfast and 1 tablet (2mg ) at bedtime  . hydrochlorothiazide (HYDRODIURIL) 25 MG tablet Take 25 mg by mouth daily.   Marland Kitchen latanoprost (XALATAN) 0.005 % ophthalmic solution Place 1 drop into both eyes at bedtime.   . metFORMIN (GLUCOPHAGE) 1000 MG tablet Take 1 tablet (1,000 mg total) by mouth 2 (two) times daily with a meal.  . metoprolol tartrate (LOPRESSOR) 50 MG tablet Take 50 mg by mouth daily.   . pravastatin (PRAVACHOL) 20 MG tablet Take 20 mg by mouth daily.     Cardiac Studies:   Echocardiogram 05/02/2016: Left ventricle cavity is small. Cavitary obliteration noted and suspect intraventricular PG. Mild concentric remodeling of the left ventricle. Basal septal hypertrophy. Normal global wall motion. Doppler evidence of grade II (pseudonormal) diastolic dysfunction, elevated LAP. Calculated EF 74%. Mild (Grade I) mitral regurgitation. No evidence of mitral valve stenosis. Moderate tricuspid regurgitation. Mild pulmonary hypertension. Pulmonary artery systolic pressure is estimated at 33 mm Hg. Mild pulmonic regurgitation.  Peripheral arteriogram  10/27/2018:  Right SFA tandem 95% mid stenosis, 1%) atrial artery occlusion.  One-vessel runoff in the form of right anterior tibial.  Successful PTCA with 5.0 X 200 mm In.Pact DCB left distal superficial femoral artery/proximal left poppliteal artery and 5.0 X 60 mm In.Pact DCB left proximal superficial femoral artery      11/24/2018: Diffuse disease in the left SFA from 60-95% stenosis, diffuse disease and popliteal vessel up to 40%, occluded left DP trunk, patent left anterior tibial with distal 100% occlusion at the ankle. Right SFA: Tandem 95% stenoses right mid SFA Subtotally occluded rt popliteal artery/ Rt TP trunk, with reconstitution of all three vessels below the knee with diffuse distal disease. Atherectomy (1.5 solid [place]back) Rt mid to distal SFA, Rt popliteal artery, Rt TP trunk PTA 3.0 X 200 mm balloon Rt common peroneal artery to Rt mid SFA PTA 5.0 X 150 mm balloon Rt mid to distal SFA 10% residual stenosis  ABI 11/25/2018: Right: Resting right ankle-brachial index indicates noncompressible right lower extremity arteries. The right toe-brachial index is abnormal. Non compressible and ABIs are unreliable. Monophasic ABI 1.15. snd TBI 0.25   Left: Resting left ankle-brachial index indicates mild left lower extremity arterial disease. The left toe-brachial index is abnormal. Monophasic ABI 0.86 and TBI 0.25 Compared to 09/16/18, Right ABI 0.40 and Left ABI 0.60.  Lower Extremity Arterial Duplex 04/05/2019:  Monophasic waveform throughout the right SFA suggests significant (>50%  right proximal SFA stenosis).  Monophasic waveform below the left knee suggests diffuse small vessel  disease below the left knee.  This exam reveals critically decreased perfusion of the right lower  extremity, noted at the anterior tibial artery level (ABI 0.40) and  moderately decreased perfusion of the left lower extremity, noted at the  post tibial artery level (ABI 0.90).  Compared to  09/16/2018, bilateral  critical decrease in ABI, right 0.40 and left 0.60 is unchanged. However post procedure ABI on 11/25/2018 was 1.15 right and 0.86 left. Consider further work up including angiography.  EKG:  EKG 04/15/2019: Normal sinus rhythm with rate of 66 bpm, normal axis.  Single PAC, otherwise normal EKG.    Assessment:     ICD-10-CM   1.  Critical limb ischemia with history of revascularization of same extremity  I99.8 Basic metabolic panel   Z95.9 CBC    rivaroxaban (XARELTO) 2.5 MG TABS tablet  2. PAD (peripheral artery disease) (HCC)  I73.9   3. Ulcer of right foot, limited to breakdown of skin (HCC)  L97.511   4. Primary hypertension  I10 EKG 12-Lead    EKG 07/13/2018: Sinus rhythm, within normal limits.  Recommendations:   Angela Benitez  is a 86 y.o. female  with hypertension, diabetes mellitus type 2 and hypercholesterolemia and PAD presents for post procedure f/u.  Due to severe symptoms of claudication, underwent peripheral arteriogram on 10/27/2018 with angioplasty to right SFA and in a staged fashion angioplasty to left SFA on 11/24/2018.  Complex procedure especially the left with severe diffuse disease, patient also developed large hematoma needing hospitalization no blood transfusion.  ABI had improved.    We have an arterial as recent lower extremity arterial duplex revealed marked abnormality with critical limb ischemia again. We wanted to discuss proceeding with angiography, patient although 86 years of age is fairly active.  She has not had up critical limb ischemia with ulceration limited dorsal right foot that started 2 weeks ago.  She also has ischemic appearing toes bilaterally.  We will try to get peripheral arteriogram set up ASAP.  There is no evidence of infection, the leg ulcer is very very superficial.  I started on Xarelto 2.5 mg p.o. twice daily, patient is presently on aspirin and Plavix, will use triple therapy for now until angiography is performed  and then decide upon the strategy.  Blood pressure is elevated today but I did not address this as I would like to have fluctuating.  I did not make any changes to her medication otherwise.  Office visit after the procedure, patient and her daughter are aware of risks associated with proceeding with angiography including but not limited to bleeding diathesis, renal failure specifically and not limited to this.  They are also aware that she has critical limb ischemia and potential for limb loss.  Jeret Goyer, MD, FACC 04/15/2019, 12:31 PM [facility] [place] Office: [phone]  

## 2019-04-16 ENCOUNTER — Other Ambulatory Visit (HOSPITAL_COMMUNITY)
Admission: RE | Admit: 2019-04-16 | Discharge: 2019-04-16 | Disposition: A | Discharge status: Home / Self Care | Payer: Medicare Other | Source: Ambulatory Visit | Attending: Cardiology | Admitting: Cardiology

## 2019-04-16 ENCOUNTER — Telehealth: Payer: Self-pay

## 2019-04-16 DIAGNOSIS — Z20822 Contact with and (suspected) exposure to covid-19: Secondary | ICD-10-CM | POA: Diagnosis not present

## 2019-04-16 DIAGNOSIS — Z01812 Encounter for preprocedural laboratory examination: Secondary | ICD-10-CM | POA: Diagnosis not present

## 2019-04-16 LAB — CBC
Hematocrit: 41.3 % (ref 34.0–46.6)
Hemoglobin: 13.1 g/dL (ref 11.1–15.9)
MCH: 29 pg (ref 26.6–33.0)
MCHC: 31.7 g/dL (ref 31.5–35.7)
MCV: 92 fL (ref 79–97)
Platelets: 245 10*3/uL (ref 150–450)
RBC: 4.51 x10E6/uL (ref 3.77–5.28)
RDW: 13.9 % (ref 11.7–15.4)
WBC: 7.8 10*3/uL (ref 3.4–10.8)

## 2019-04-16 LAB — BASIC METABOLIC PANEL
BUN/Creatinine Ratio: 16 (ref 12–28)
BUN: 18 mg/dL (ref 8–27)
CO2: 23 mmol/L (ref 20–29)
Calcium: 9.9 mg/dL (ref 8.7–10.3)
Chloride: 108 mmol/L — ABNORMAL HIGH (ref 96–106)
Creatinine, Ser: 1.1 mg/dL — ABNORMAL HIGH (ref 0.57–1.00)
GFR calc Af Amer: 53 mL/min/{1.73_m2} — ABNORMAL LOW (ref >59)
GFR calc non Af Amer: 46 mL/min/{1.73_m2} — ABNORMAL LOW (ref >59)
Glucose: 58 mg/dL — ABNORMAL LOW (ref 65–99)
Potassium: 4.5 mmol/L (ref 3.5–5.2)
Sodium: 145 mmol/L — ABNORMAL HIGH (ref 134–144)

## 2019-04-16 LAB — SARS CORONAVIRUS 2 (TAT 6-24 HRS): SARS Coronavirus 2: NEGATIVE

## 2019-04-16 NOTE — Telephone Encounter (Signed)
Daughter is concerned about mother taking aspirin, Plavix and Xarelto. Pharmacy advised her these should not be taken together. She wants to confirm with you that it is ok for mom to continue taking these three medications until she hears from [place] after her testing is complete. I advised patient daughter of your recommendation from recent OV. Daughter wants conformation from you this is ok.

## 2019-04-16 NOTE — Telephone Encounter (Signed)
I have clearly documented this in my OV. Yes all three

## 2019-04-19 DIAGNOSIS — I998 Other disorder of circulatory system: Secondary | ICD-10-CM | POA: Diagnosis present

## 2019-04-19 DIAGNOSIS — Z9889 Other specified postprocedural states: Secondary | ICD-10-CM | POA: Diagnosis present

## 2019-04-20 ENCOUNTER — Encounter (HOSPITAL_COMMUNITY)
Admission: RE | Disposition: A | Discharge status: Home / Self Care | Payer: Self-pay | Source: Home / Self Care | Attending: Cardiology

## 2019-04-20 ENCOUNTER — Other Ambulatory Visit: Payer: Self-pay

## 2019-04-20 ENCOUNTER — Observation Stay (HOSPITAL_COMMUNITY)
Admission: RE | Admit: 2019-04-20 | Discharge: 2019-04-21 | Disposition: A | Discharge status: Home / Self Care | Payer: Medicare Other | Attending: Cardiology | Admitting: Cardiology

## 2019-04-20 DIAGNOSIS — I129 Hypertensive chronic kidney disease with stage 1 through stage 4 chronic kidney disease, or unspecified chronic kidney disease: Secondary | ICD-10-CM | POA: Diagnosis not present

## 2019-04-20 DIAGNOSIS — I70235 Atherosclerosis of native arteries of right leg with ulceration of other part of foot: Secondary | ICD-10-CM | POA: Diagnosis not present

## 2019-04-20 DIAGNOSIS — Z7984 Long term (current) use of oral hypoglycemic drugs: Secondary | ICD-10-CM | POA: Diagnosis not present

## 2019-04-20 DIAGNOSIS — L97511 Non-pressure chronic ulcer of other part of right foot limited to breakdown of skin: Secondary | ICD-10-CM | POA: Insufficient documentation

## 2019-04-20 DIAGNOSIS — I70229 Atherosclerosis of native arteries of extremities with rest pain, unspecified extremity: Secondary | ICD-10-CM | POA: Diagnosis present

## 2019-04-20 DIAGNOSIS — E1151 Type 2 diabetes mellitus with diabetic peripheral angiopathy without gangrene: Principal | ICD-10-CM | POA: Insufficient documentation

## 2019-04-20 DIAGNOSIS — I739 Peripheral vascular disease, unspecified: Secondary | ICD-10-CM | POA: Diagnosis present

## 2019-04-20 DIAGNOSIS — I9788 Other intraoperative complications of the circulatory system, not elsewhere classified: Secondary | ICD-10-CM | POA: Diagnosis present

## 2019-04-20 DIAGNOSIS — E1140 Type 2 diabetes mellitus with diabetic neuropathy, unspecified: Secondary | ICD-10-CM | POA: Insufficient documentation

## 2019-04-20 DIAGNOSIS — E785 Hyperlipidemia, unspecified: Secondary | ICD-10-CM | POA: Diagnosis not present

## 2019-04-20 DIAGNOSIS — Z7902 Long term (current) use of antithrombotics/antiplatelets: Secondary | ICD-10-CM | POA: Insufficient documentation

## 2019-04-20 DIAGNOSIS — N1831 Chronic kidney disease, stage 3a: Secondary | ICD-10-CM | POA: Diagnosis not present

## 2019-04-20 DIAGNOSIS — Z7982 Long term (current) use of aspirin: Secondary | ICD-10-CM | POA: Diagnosis not present

## 2019-04-20 DIAGNOSIS — Z79899 Other long term (current) drug therapy: Secondary | ICD-10-CM | POA: Diagnosis not present

## 2019-04-20 DIAGNOSIS — E11621 Type 2 diabetes mellitus with foot ulcer: Secondary | ICD-10-CM | POA: Diagnosis not present

## 2019-04-20 DIAGNOSIS — Z7901 Long term (current) use of anticoagulants: Secondary | ICD-10-CM | POA: Diagnosis not present

## 2019-04-20 DIAGNOSIS — I998 Other disorder of circulatory system: Secondary | ICD-10-CM | POA: Insufficient documentation

## 2019-04-20 DIAGNOSIS — E1122 Type 2 diabetes mellitus with diabetic chronic kidney disease: Secondary | ICD-10-CM | POA: Insufficient documentation

## 2019-04-20 DIAGNOSIS — Z959 Presence of cardiac and vascular implant and graft, unspecified: Secondary | ICD-10-CM | POA: Diagnosis not present

## 2019-04-20 DIAGNOSIS — I9589 Other hypotension: Secondary | ICD-10-CM | POA: Diagnosis not present

## 2019-04-20 DIAGNOSIS — I70238 Atherosclerosis of native arteries of right leg with ulceration of other part of lower right leg: Secondary | ICD-10-CM | POA: Diagnosis not present

## 2019-04-20 HISTORY — PX: LOWER EXTREMITY ANGIOGRAPHY: CATH118251

## 2019-04-20 HISTORY — PX: PERIPHERAL VASCULAR BALLOON ANGIOPLASTY: CATH118281

## 2019-04-20 LAB — GLUCOSE, CAPILLARY
Glucose-Capillary: 110 mg/dL — ABNORMAL HIGH (ref 70–99)
Glucose-Capillary: 188 mg/dL — ABNORMAL HIGH (ref 70–99)
Glucose-Capillary: 167 mg/dL — ABNORMAL HIGH (ref 70–99)

## 2019-04-20 LAB — MRSA PCR SCREENING: MRSA by PCR: NEGATIVE

## 2019-04-20 LAB — CBC
HCT: 35.7 % — ABNORMAL LOW (ref 36.0–46.0)
Hemoglobin: 11.4 g/dL — ABNORMAL LOW (ref 12.0–15.0)
MCH: 29.1 pg (ref 26.0–34.0)
MCHC: 31.9 g/dL (ref 30.0–36.0)
MCV: 91.1 fL (ref 80.0–100.0)
Platelets: 242 10*3/uL (ref 150–400)
RBC: 3.92 MIL/uL (ref 3.87–5.11)
RDW: 15.3 % (ref 11.5–15.5)
WBC: 9.3 10*3/uL (ref 4.0–10.5)
nRBC: 0 % (ref 0.0–0.2)

## 2019-04-20 LAB — POCT ACTIVATED CLOTTING TIME
Activated Clotting Time: 219 s
Activated Clotting Time: 279 seconds
Activated Clotting Time: 246 seconds
Activated Clotting Time: 307 seconds

## 2019-04-20 SURGERY — LOWER EXTREMITY ANGIOGRAPHY
Anesthesia: LOCAL

## 2019-04-20 MED ORDER — LIDOCAINE-EPINEPHRINE 1 %-1:100000 IJ SOLN
INTRAMUSCULAR | Status: DC | PRN
  Administered 2019-04-20: 3 mL

## 2019-04-20 MED ORDER — NOREPINEPHRINE 4 MG/250ML-% IV SOLN: 2.0000 ug/min | INTRAVENOUS | Status: DC

## 2019-04-20 MED ORDER — NITROGLYCERIN 1 MG/10 ML FOR IR/CATH LAB
INTRA_ARTERIAL | Status: AC
  Filled 2019-04-20: qty 10

## 2019-04-20 MED ORDER — SODIUM CHLORIDE 0.9% FLUSH: 3.0000 mL | INTRAVENOUS | Status: DC | PRN

## 2019-04-20 MED ORDER — CLOPIDOGREL BISULFATE 75 MG PO TABS: 75.0000 mg | ORAL_TABLET | Freq: Once | ORAL | Status: DC

## 2019-04-20 MED ORDER — SODIUM CHLORIDE 0.9 % IV SOLN: INTRAVENOUS | Status: DC

## 2019-04-20 MED ORDER — HYDRALAZINE HCL 20 MG/ML IJ SOLN
INTRAMUSCULAR | Status: DC | PRN
  Administered 2019-04-20: 10 mg via INTRAVENOUS

## 2019-04-20 MED ORDER — ACETAMINOPHEN 325 MG PO TABS: 650.0000 mg | ORAL_TABLET | ORAL | Status: DC | PRN

## 2019-04-20 MED ORDER — HEPARIN (PORCINE) IN NACL 1000-0.9 UT/500ML-% IV SOLN
INTRAVENOUS | Status: DC | PRN
  Administered 2019-04-20: 500 mL

## 2019-04-20 MED ORDER — LIDOCAINE HCL (PF) 1 % IJ SOLN
INTRAMUSCULAR | Status: AC
  Filled 2019-04-20: qty 30

## 2019-04-20 MED ORDER — SODIUM CHLORIDE 0.9 % IV SOLN: INTRAVENOUS | Status: AC

## 2019-04-20 MED ORDER — ADULT MULTIVITAMIN W/MINERALS CH
1.0000 | ORAL_TABLET | Freq: Every day | ORAL | Status: DC
  Administered 2019-04-20 – 2019-04-21 (×2): 1 via ORAL
  Filled 2019-04-20 (×2): qty 1

## 2019-04-20 MED ORDER — NOREPINEPHRINE 4 MG/250ML-% IV SOLN
INTRAVENOUS | Status: AC
  Filled 2019-04-20: qty 250

## 2019-04-20 MED ORDER — ASPIRIN 81 MG PO CHEW
81.0000 mg | CHEWABLE_TABLET | ORAL | Status: AC
  Administered 2019-04-20: 09:00:00 81 mg via ORAL
  Filled 2019-04-20: qty 1

## 2019-04-20 MED ORDER — LATANOPROST 0.005 % OP SOLN
1.0000 [drp] | Freq: Every day | OPHTHALMIC | Status: DC
  Administered 2019-04-20: 22:00:00 1 [drp] via OPHTHALMIC
  Filled 2019-04-20: qty 2.5

## 2019-04-20 MED ORDER — GABAPENTIN 100 MG PO CAPS: 100.0000 mg | ORAL_CAPSULE | Freq: Every evening | ORAL | Status: DC | PRN

## 2019-04-20 MED ORDER — ASPIRIN EC 81 MG PO TBEC
81.0000 mg | DELAYED_RELEASE_TABLET | Freq: Every day | ORAL | Status: DC
  Administered 2019-04-20 – 2019-04-21 (×2): 81 mg via ORAL
  Filled 2019-04-20 (×2): qty 1

## 2019-04-20 MED ORDER — MIDAZOLAM HCL 2 MG/2ML IJ SOLN
INTRAMUSCULAR | Status: DC | PRN
  Administered 2019-04-20 (×2): 1 mg via INTRAVENOUS

## 2019-04-20 MED ORDER — SODIUM CHLORIDE 0.9 % IV SOLN: 250.0000 mL | INTRAVENOUS | Status: DC

## 2019-04-20 MED ORDER — SODIUM CHLORIDE 0.9 % IV SOLN: 250.0000 mL | INTRAVENOUS | Status: DC | PRN

## 2019-04-20 MED ORDER — ONDANSETRON HCL 4 MG/2ML IJ SOLN
INTRAMUSCULAR | Status: DC | PRN
  Administered 2019-04-20: 4 mg via INTRAVENOUS

## 2019-04-20 MED ORDER — HEPARIN (PORCINE) IN NACL 1000-0.9 UT/500ML-% IV SOLN
INTRAVENOUS | Status: AC
  Filled 2019-04-20: qty 1000

## 2019-04-20 MED ORDER — ACETAMINOPHEN 500 MG PO TABS: 500.0000 mg | ORAL_TABLET | Freq: Four times a day (QID) | ORAL | Status: DC | PRN

## 2019-04-20 MED ORDER — NITROGLYCERIN 1 MG/10 ML FOR IR/CATH LAB
INTRA_ARTERIAL | Status: DC | PRN
  Administered 2019-04-20: 500 ug via INTRA_ARTERIAL

## 2019-04-20 MED ORDER — HEPARIN SODIUM (PORCINE) 1000 UNIT/ML IJ SOLN
INTRAMUSCULAR | Status: DC | PRN
  Administered 2019-04-20 (×2): 3000 [IU] via INTRAVENOUS
  Administered 2019-04-20: 6000 [IU] via INTRAVENOUS

## 2019-04-20 MED ORDER — FENTANYL CITRATE (PF) 100 MCG/2ML IJ SOLN
INTRAMUSCULAR | Status: DC | PRN
  Administered 2019-04-20 (×2): 25 ug via INTRAVENOUS

## 2019-04-20 MED ORDER — HEPARIN SODIUM (PORCINE) 1000 UNIT/ML IJ SOLN
INTRAMUSCULAR | Status: AC
  Filled 2019-04-20: qty 1

## 2019-04-20 MED ORDER — CHLORHEXIDINE GLUCONATE CLOTH 2 % EX PADS
6.0000 | MEDICATED_PAD | Freq: Every day | CUTANEOUS | Status: DC
  Administered 2019-04-20: 17:00:00 6 via TOPICAL

## 2019-04-20 MED ORDER — SODIUM CHLORIDE 0.9 % IV SOLN
INTRAVENOUS | Status: AC | PRN
  Administered 2019-04-20: 150 mL/h via INTRAVENOUS

## 2019-04-20 MED ORDER — HYDROCHLOROTHIAZIDE 25 MG PO TABS
25.0000 mg | ORAL_TABLET | Freq: Every day | ORAL | Status: DC
  Administered 2019-04-21: 11:00:00 25 mg via ORAL
  Filled 2019-04-20: qty 1

## 2019-04-20 MED ORDER — LABETALOL HCL 5 MG/ML IV SOLN: 10.0000 mg | INTRAVENOUS | Status: DC | PRN

## 2019-04-20 MED ORDER — PRAVASTATIN SODIUM 10 MG PO TABS
20.0000 mg | ORAL_TABLET | Freq: Every day | ORAL | Status: DC
  Administered 2019-04-20 – 2019-04-21 (×2): 20 mg via ORAL
  Filled 2019-04-20 (×2): qty 2

## 2019-04-20 MED ORDER — SODIUM CHLORIDE 0.9 % WEIGHT BASED INFUSION: 1.0000 mL/kg/h | INTRAVENOUS | Status: DC

## 2019-04-20 MED ORDER — LIDOCAINE-EPINEPHRINE 1 %-1:100000 IJ SOLN
INTRAMUSCULAR | Status: AC
  Filled 2019-04-20: qty 1

## 2019-04-20 MED ORDER — FENTANYL CITRATE (PF) 100 MCG/2ML IJ SOLN
INTRAMUSCULAR | Status: AC
  Filled 2019-04-20: qty 2

## 2019-04-20 MED ORDER — HYDRALAZINE HCL 20 MG/ML IJ SOLN
INTRAMUSCULAR | Status: AC
  Filled 2019-04-20: qty 1

## 2019-04-20 MED ORDER — SODIUM CHLORIDE 0.9% FLUSH
3.0000 mL | Freq: Two times a day (BID) | INTRAVENOUS | Status: DC
  Administered 2019-04-21: 11:00:00 3 mL via INTRAVENOUS

## 2019-04-20 MED ORDER — ONDANSETRON HCL 4 MG/2ML IJ SOLN
INTRAMUSCULAR | Status: AC
  Filled 2019-04-20: qty 2

## 2019-04-20 MED ORDER — SODIUM CHLORIDE 0.9% FLUSH
3.0000 mL | Freq: Two times a day (BID) | INTRAVENOUS | Status: DC
  Administered 2019-04-20: 22:00:00 3 mL via INTRAVENOUS

## 2019-04-20 MED ORDER — SODIUM CHLORIDE 0.9 % WEIGHT BASED INFUSION
3.0000 mL/kg/h | INTRAVENOUS | Status: DC
  Administered 2019-04-20: 3 mL/kg/h via INTRAVENOUS

## 2019-04-20 MED ORDER — RIVAROXABAN 2.5 MG PO TABS
2.5000 mg | ORAL_TABLET | Freq: Two times a day (BID) | ORAL | Status: DC
  Administered 2019-04-20: 22:00:00 2.5 mg via ORAL
  Filled 2019-04-20 (×3): qty 1

## 2019-04-20 MED ORDER — SODIUM CHLORIDE 0.9% FLUSH: 3.0000 mL | Freq: Two times a day (BID) | INTRAVENOUS | Status: DC

## 2019-04-20 MED ORDER — MIDAZOLAM HCL 2 MG/2ML IJ SOLN
INTRAMUSCULAR | Status: AC
  Filled 2019-04-20: qty 2

## 2019-04-20 MED ORDER — ONDANSETRON HCL 4 MG/2ML IJ SOLN: 4.0000 mg | Freq: Four times a day (QID) | INTRAMUSCULAR | Status: DC | PRN

## 2019-04-20 MED ORDER — NOREPINEPHRINE BITARTRATE 1 MG/ML IV SOLN
INTRAVENOUS | Status: AC | PRN
  Administered 2019-04-20: 10 ug/min via INTRAVENOUS

## 2019-04-20 MED ORDER — LIDOCAINE HCL (PF) 1 % IJ SOLN
INTRAMUSCULAR | Status: DC | PRN
  Administered 2019-04-20: 15 mL

## 2019-04-20 MED ORDER — SODIUM CHLORIDE 0.9% IV SOLUTION: Freq: Once | INTRAVENOUS | Status: DC

## 2019-04-20 MED ORDER — RIVAROXABAN 2.5 MG PO TABS: 2.5000 mg | ORAL_TABLET | Freq: Two times a day (BID) | ORAL | Status: DC

## 2019-04-20 SURGICAL SUPPLY — 30 items
BALLN COYOTE OTW 2X80X150 (BALLOONS) ×3
BALLN COYOTE OTW 3.5X80X150 (BALLOONS) ×3
BALLN COYOTE OTW 4X150X150 (BALLOONS) ×3
BALLOON COYOTE OTW 2X80X150 (BALLOONS) IMPLANT
BALLOON COYOTE OTW 3.5X80X150 (BALLOONS) IMPLANT
BALLOON COYOTE OTW 4X150X150 (BALLOONS) IMPLANT
CATH AURYON 6FR ATHEREC 2.0 (CATHETERS) ×1 IMPLANT
CATH CROSS OVER TEMPO 5F (CATHETERS) ×1 IMPLANT
CATH CXI 2.3F 150 ST (CATHETERS) ×1 IMPLANT
CATH OMNI FLUSH 5F 65CM (CATHETERS) ×1 IMPLANT
CATH SOFT-VU 4F 65 STRAIGHT (CATHETERS) IMPLANT
CATH SOFT-VU STRAIGHT 4F 65CM (CATHETERS) ×3
DEVICE CLOSURE PERCLS PRGLD 6F (VASCULAR PRODUCTS) IMPLANT
DEVICE TORQUE .014-.018 (MISCELLANEOUS) IMPLANT
GUIDEWIRE ASTATO XS 20G 300CM (WIRE) ×1 IMPLANT
KIT ENCORE 26 ADVANTAGE (KITS) ×1 IMPLANT
KIT MICROPUNCTURE NIT STIFF (SHEATH) ×1 IMPLANT
KIT PV (KITS) ×3 IMPLANT
PERCLOSE PROGLIDE 6F (VASCULAR PRODUCTS) ×6
SHEATH PINNACLE 5F 10CM (SHEATH) ×1 IMPLANT
SHEATH PINNACLE 7F 10CM (SHEATH) ×1 IMPLANT
SHEATH PINNACLE ST 7F 45CM (SHEATH) ×1 IMPLANT
SHEATH PROBE COVER 6X72 (BAG) ×1 IMPLANT
SHIELD RADPAD SCOOP 12X17 (MISCELLANEOUS) ×1 IMPLANT
SYR MEDRAD MARK 7 150ML (SYRINGE) ×3 IMPLANT
TORQUE DEVICE .014-.018 (MISCELLANEOUS) ×3
TRANSDUCER W/STOPCOCK (MISCELLANEOUS) ×3 IMPLANT
TRAY PV CATH (CUSTOM PROCEDURE TRAY) ×3 IMPLANT
WIRE BENTSON .035X145CM (WIRE) ×2 IMPLANT
WIRE SPARTACORE .014X300CM (WIRE) ×2 IMPLANT

## 2019-04-20 NOTE — Interval H&P Note (Signed)
History and Physical Interval Note:  04/20/2019 2:42 PM  Angela Benitez  has presented today for surgery, with the diagnosis of PAD/ Claudication.  The various methods of treatment have been discussed with the patient and family. After consideration of risks, benefits and other options for treatment, the patient has consented to  Procedure(s) with comments: Lower Extremity Angiography - Bilateral limited study PERIPHERAL VASCULAR BALLOON ANGIOPLASTY - With Laser treatment as a surgical intervention.  The patient's history has been reviewed, patient examined, no change in status, stable for surgery.  I have reviewed the patient's chart and labs.  Questions were answered to the patient's satisfaction.     Yates Decamp

## 2019-04-20 NOTE — Progress Notes (Signed)
Looks great. Okay to wean off levophed keeping SBP >120 mm Hg. Patient smiling and laughing at bedside and daughter present. No groin complication.  Yates Decamp, MD, [place] 04/20/2019, 6:13 PM [facility] [place] Office: [phone]

## 2019-04-20 NOTE — CV Procedure (Signed)
Procedure performed: Left femoral arterial access using ultrasound guidance, distal abdominal aortogram with limited bifemoral arteriogram, crossover from the left into the right femoral artery and right femoral arteriogram with distal runoff. Atherectomy using [place] 2.0 Solid state Laser of the right mid and distal SFA and Proximal Popliteal artery. Balloon PTCA of the Right TP trunk, Popliteal and mid and distal SFA.  100% to < 10%.   Complications: Patient had vasovagal episode and received 10 mg of intravenous hydralazine at the same time leading to significant hypotension and seizure.  Patient was started on IV norepinephrine, stabilized blood pressure at 5 mcg/kg/min.  Total blood loss about 500 to 600 mL during the procedure.  Patient will be observed in the intensive care tonight.  Will be discharged home in the morning.  Left groin site was closed with Perclose with excellent hemostasis.

## 2019-04-20 NOTE — Discharge Instructions (Signed)
Femoral Site Care This sheet gives you information about how to care for yourself after your procedure. Your health care provider may also give you more specific instructions. If you have problems or questions, contact your health care provider. What can I expect after the procedure? After the procedure, it is common to have:  Bruising that usually fades within 1-2 weeks.  Tenderness at the site. Follow these instructions at home: Wound care  Follow instructions from your health care provider about how to take care of your insertion site. Make sure you: ? Wash your hands with soap and water before you change your bandage (dressing). If soap and water are not available, use hand sanitizer. ? Change your dressing as told by your health care provider. ? Leave stitches (sutures), skin glue, or adhesive strips in place. These skin closures may need to stay in place for 2 weeks or longer. If adhesive strip edges start to loosen and curl up, you may trim the loose edges. Do not remove adhesive strips completely unless your health care provider tells you to do that.  Do not take baths, swim, or use a hot tub until your health care provider approves.  You may shower 24-48 hours after the procedure or as told by your health care provider. ? Gently wash the site with plain soap and water. ? Pat the area dry with a clean towel. ? Do not rub the site. This may cause bleeding.  Do not apply powder or lotion to the site. Keep the site clean and dry.  Check your femoral site every day for signs of infection. Check for: ? Redness, swelling, or pain. ? Fluid or blood. ? Warmth. ? Pus or a bad smell. Activity  For the first 2-3 days after your procedure, or as long as directed: ? Avoid climbing stairs as much as possible. ? Do not squat.  Do not lift anything that is heavier than 10 lb (4.5 kg), or the limit that you are told, until your health care provider says that it is safe.  Rest as  directed. ? Avoid sitting for a long time without moving. Get up to take short walks every 1-2 hours.  Do not drive for 24 hours if you were given a medicine to help you relax (sedative). General instructions  Take over-the-counter and prescription medicines only as told by your health care provider.  Keep all follow-up visits as told by your health care provider. This is important. Contact a health care provider if you have:  A fever or chills.  You have redness, swelling, or pain around your insertion site. Get help right away if:  The catheter insertion area swells very fast.  You pass out.  You suddenly start to sweat or your skin gets clammy.  The catheter insertion area is bleeding, and the bleeding does not stop when you hold steady pressure on the area.  The area near or just beyond the catheter insertion site becomes pale, cool, tingly, or numb. These symptoms may represent a serious problem that is an emergency. Do not wait to see if the symptoms will go away. Get medical help right away. Call your local emergency services (911 in the [place] Do not drive yourself to the hospital. Summary  After the procedure, it is common to have bruising that usually fades within 1-2 weeks.  Check your femoral site every day for signs of infection.  Do not lift anything that is heavier than 10 lb (4.5 kg), or the   limit that you are told, until your health care provider says that it is safe. This information is not intended to replace advice given to you by your health care provider. Make sure you discuss any questions you have with your health care provider. Document Revised: 01/27/2017 Document Reviewed: 01/27/2017 Elsevier Patient Education  2020 Elsevier Inc.  

## 2019-04-21 ENCOUNTER — Encounter (HOSPITAL_COMMUNITY): Payer: Self-pay | Admitting: Cardiology

## 2019-04-21 DIAGNOSIS — I70235 Atherosclerosis of native arteries of right leg with ulceration of other part of foot: Secondary | ICD-10-CM | POA: Diagnosis not present

## 2019-04-21 DIAGNOSIS — I70238 Atherosclerosis of native arteries of right leg with ulceration of other part of lower right leg: Secondary | ICD-10-CM | POA: Diagnosis not present

## 2019-04-21 DIAGNOSIS — Z959 Presence of cardiac and vascular implant and graft, unspecified: Secondary | ICD-10-CM | POA: Diagnosis not present

## 2019-04-21 DIAGNOSIS — I129 Hypertensive chronic kidney disease with stage 1 through stage 4 chronic kidney disease, or unspecified chronic kidney disease: Secondary | ICD-10-CM | POA: Diagnosis not present

## 2019-04-21 DIAGNOSIS — E1140 Type 2 diabetes mellitus with diabetic neuropathy, unspecified: Secondary | ICD-10-CM | POA: Diagnosis not present

## 2019-04-21 DIAGNOSIS — E1151 Type 2 diabetes mellitus with diabetic peripheral angiopathy without gangrene: Secondary | ICD-10-CM | POA: Diagnosis not present

## 2019-04-21 DIAGNOSIS — E11621 Type 2 diabetes mellitus with foot ulcer: Secondary | ICD-10-CM | POA: Diagnosis not present

## 2019-04-21 DIAGNOSIS — I9589 Other hypotension: Secondary | ICD-10-CM | POA: Diagnosis not present

## 2019-04-21 DIAGNOSIS — L97511 Non-pressure chronic ulcer of other part of right foot limited to breakdown of skin: Secondary | ICD-10-CM | POA: Diagnosis not present

## 2019-04-21 DIAGNOSIS — I9788 Other intraoperative complications of the circulatory system, not elsewhere classified: Secondary | ICD-10-CM | POA: Diagnosis not present

## 2019-04-21 DIAGNOSIS — I739 Peripheral vascular disease, unspecified: Secondary | ICD-10-CM | POA: Diagnosis not present

## 2019-04-21 LAB — BASIC METABOLIC PANEL
Anion gap: 7 (ref 5–15)
BUN: 19 mg/dL (ref 8–23)
CO2: 24 mmol/L (ref 22–32)
Calcium: 8.6 mg/dL — ABNORMAL LOW (ref 8.9–10.3)
Chloride: 109 mmol/L (ref 98–111)
Creatinine, Ser: 1.15 mg/dL — ABNORMAL HIGH (ref 0.44–1.00)
GFR calc Af Amer: 51 mL/min — ABNORMAL LOW (ref >60)
GFR calc non Af Amer: 44 mL/min — ABNORMAL LOW (ref >60)
Glucose, Bld: 159 mg/dL — ABNORMAL HIGH (ref 70–99)
Potassium: 4.3 mmol/L (ref 3.5–5.1)
Sodium: 140 mmol/L (ref 135–145)

## 2019-04-21 LAB — CBC
HCT: 31.9 % — ABNORMAL LOW (ref 36.0–46.0)
Hemoglobin: 10.4 g/dL — ABNORMAL LOW (ref 12.0–15.0)
MCH: 29.4 pg (ref 26.0–34.0)
MCHC: 32.6 g/dL (ref 30.0–36.0)
MCV: 90.1 fL (ref 80.0–100.0)
Platelets: 194 10*3/uL (ref 150–400)
RBC: 3.54 MIL/uL — ABNORMAL LOW (ref 3.87–5.11)
RDW: 15.2 % (ref 11.5–15.5)
WBC: 9.9 10*3/uL (ref 4.0–10.5)
nRBC: 0 % (ref 0.0–0.2)

## 2019-04-21 LAB — ABO/RH: ABO/RH(D): B POS

## 2019-04-21 LAB — TYPE AND SCREEN
ABO/RH(D): B POS
Antibody Screen: NEGATIVE

## 2019-04-21 NOTE — Plan of Care (Signed)
  Problem: Education: Goal: Knowledge of General Education information will improve Description: Including pain rating scale, medication(s)/side effects and non-pharmacologic comfort measures 04/21/2019 1227 by Tenna Child, RN Outcome: Adequate for Discharge 04/21/2019 1227 by Tenna Child, RN Outcome: Adequate for Discharge   Problem: Health Behavior/Discharge Planning: Goal: Ability to manage health-related needs will improve 04/21/2019 1227 by Tenna Child, RN Outcome: Adequate for Discharge 04/21/2019 1227 by Tenna Child, RN Outcome: Adequate for Discharge   Problem: Clinical Measurements: Goal: Ability to maintain clinical measurements within normal limits will improve 04/21/2019 1227 by Tenna Child, RN Outcome: Adequate for Discharge 04/21/2019 1227 by Tenna Child, RN Outcome: Adequate for Discharge Goal: Will remain free from infection 04/21/2019 1227 by Tenna Child, RN Outcome: Adequate for Discharge 04/21/2019 1227 by Tenna Child, RN Outcome: Adequate for Discharge Goal: Diagnostic test results will improve 04/21/2019 1227 by Tenna Child, RN Outcome: Adequate for Discharge 04/21/2019 1227 by Tenna Child, RN Outcome: Adequate for Discharge Goal: Respiratory complications will improve 04/21/2019 1227 by Tenna Child, RN Outcome: Adequate for Discharge 04/21/2019 1227 by Tenna Child, RN Outcome: Adequate for Discharge Goal: Cardiovascular complication will be avoided 04/21/2019 1227 by Tenna Child, RN Outcome: Adequate for Discharge 04/21/2019 1227 by Tenna Child, RN Outcome: Adequate for Discharge   Problem: Activity: Goal: Risk for activity intolerance will decrease 04/21/2019 1227 by Tenna Child, RN Outcome: Adequate for Discharge 04/21/2019 1227 by Tenna Child, RN Outcome: Adequate for Discharge   Problem: Nutrition: Goal: Adequate nutrition will be maintained 04/21/2019 1227 by Tenna Child, RN Outcome: Adequate for Discharge 04/21/2019 1227  by Tenna Child, RN Outcome: Adequate for Discharge   Problem: Coping: Goal: Level of anxiety will decrease 04/21/2019 1227 by Tenna Child, RN Outcome: Adequate for Discharge 04/21/2019 1227 by Tenna Child, RN Outcome: Adequate for Discharge   Problem: Elimination: Goal: Will not experience complications related to bowel motility 04/21/2019 1227 by Tenna Child, RN Outcome: Adequate for Discharge 04/21/2019 1227 by Tenna Child, RN Outcome: Adequate for Discharge Goal: Will not experience complications related to urinary retention 04/21/2019 1227 by Tenna Child, RN Outcome: Adequate for Discharge 04/21/2019 1227 by Tenna Child, RN Outcome: Adequate for Discharge   Problem: Pain Managment: Goal: General experience of comfort will improve 04/21/2019 1227 by Tenna Child, RN Outcome: Adequate for Discharge 04/21/2019 1227 by Tenna Child, RN Outcome: Adequate for Discharge   Problem: Safety: Goal: Ability to remain free from injury will improve 04/21/2019 1227 by Tenna Child, RN Outcome: Adequate for Discharge 04/21/2019 1227 by Tenna Child, RN Outcome: Adequate for Discharge   Problem: Skin Integrity: Goal: Risk for impaired skin integrity will decrease 04/21/2019 1227 by Tenna Child, RN Outcome: Adequate for Discharge 04/21/2019 1227 by Tenna Child, RN Outcome: Adequate for Discharge

## 2019-04-21 NOTE — Care Management Important Message (Signed)
Important Message  Patient Details  Name: Angela Benitez MRN: [id number] Date of Birth: [birth year]   Medicare Important Message Given:  Yes     Janae Bridgeman, RN 04/21/2019, 11:24 AM

## 2019-04-21 NOTE — Care Management CC44 (Signed)
Condition Code 44 Documentation Completed  Patient Details  Name: Angela Benitez MRN: [id number] Date of Birth: Jan 31, 1933   Condition Code 44 given:  Yes Patient signature on Condition Code 44 notice:  Yes Documentation of 2 MD's agreement:  Yes Code 44 added to claim:  Yes    Janae Bridgeman, RN 04/21/2019, 11:28 AM

## 2019-04-21 NOTE — Care Management Obs Status (Signed)
MEDICARE OBSERVATION STATUS NOTIFICATION   Patient Details  Name: Angela Benitez MRN: [id number] Date of Birth: March 10, 1933   Medicare Observation Status Notification Given:  Yes    Janae Bridgeman, RN 04/21/2019, 11:27 AM

## 2019-04-21 NOTE — Discharge Summary (Signed)
Physician Discharge Summary  Patient ID: Angela Benitez MRN: [id number] DOB/AGE: 06-11-1933 86 y.o.  Admit date: 05-08-19 Discharge date: 04/21/2019  Primary Discharge Diagnosis Critical limb ischemia Right foot ulcer limited to skin breakdown PAD  Secondary Discharge Diagnosis Diabetes mellitus type 2 controlled with stage IIIa chronic kidney disease Hypertension  Significant Diagnostic Studies:  Peripheral arteriogram 08-May-2019  Findings: Distal abdominal aorta and aortoiliac bifurcation widely patent, bilateral common femoral arteries are widely patent. Right SFA is calcified and diffusely diseased, mid and distal segment is occluded.  It reconstitutes below the right knee via collaterals, right popliteal artery and TP trunk are tandem occluded.  Successful atherectomy using Auryon 2.0 Solid state Laser of the right mid and distal SFA and Proximal Popliteal artery. Balloon PTCA of the Right TP trunk,  [id number], Popliteal and mid and distal SFA BALLOON COYOTE OTW [id number].  100% to < 10%.  There was brisk flow evident all the way from the mid SFA on the right all the way up to TP trunk.  Below the right knee, there is no direct communication and vessels are free by collaterals.  Primary lesion: Right mid and distal SFA. Secondary lesion: Right popliteal and TP trunk  TASC Classification  Largest Sheath Size: 7  Target vessel: Right mid and distal SFA, right popliteal and right TP trunk  % Stenosis: Pre 100. Post 10.  Lesion length: 250 mm  Calcification: Moderate  Most impactful devices used (Up to 3): Auryon solid state laser 2.0 mm   Outflow: Disease present or not distal to the lesion treated and the  Flow in the distal vessel:  Distal vessel no target, but extensive collaterals perfuse the entire right leg. Very brisk flow noted post intervention   Complications: Patient had vasovagal episode with nausea and vomiting and received 10 mg of intravenous hydralazine  for hypertension at the same time leading to significant hypotension and seizure.  Patient was started on IV norepinephrine, stabilized blood pressure at 5 mcg/kg/min.  Total blood loss about 400-500 mL during the procedure due to aspiration during laser atherectomy.  Patient will be observed in the intensive care tonight.  Will be discharged home in the morning.  I have discussed the procedure results and the complication that occurred in the cardiac catheterization lab with [name].  Hospital Course: Patient admitted for semiurgent peripheral arteriogram in view of chronic critical limb ischemia, underwent intervention to her right SFA and popliteal artery as detailed above, postprocedure had hypertension which was treated with intravenous 10 mg hydralazine, at the same time being a prolonged procedure and patient having received fentanyl and Versed, felt nauseous and started throwing up and had severe vasovagal episode with sudden drop in blood pressure leading to an episode of seizure, persistent hypotension related to use of transient norepinephrine with immediate stabilization.  Patient was uneventfully weaned off of pressors the same evening.  She was kept overnight for observation and discharged home the following morning.  Overall CBC has remained stable, although admission CBC revealed hemoglobin of 13, suspect it is probably hemoconcentration as prior hemoglobin has been around 11.5 range.  Recommendations on discharge: Patient will be discharged on aspirin along with Xarelto 2.5 mg p.o. twice daily, will discontinue Plavix in view of patient being on Xarelto to decrease bleeding risk.  She will be followed up in the outpatient basis closely, she will need ABI to document improvement in flow prior to my next office visit.  Discharge Exam: Blood pressure (!) 149/102,  pulse 99, temperature 99 F (37.2 C), temperature source Oral, resp. rate 20, height 5\' 3"  (1.6 m), weight 61.1  kg, SpO2 (!) 68 %.  Physical Exam  Constitutional: Vital signs are normal. She appears well-developed and well-nourished.  Cardiovascular: Normal rate, regular rhythm and intact distal pulses.  Murmur heard.  Early systolic murmur is present with a grade of 1/6 at the upper right sternal border. Pulses:      Carotid pulses are on the left side with bruit.      Femoral pulses are 1+ on the right side and 2+ on the left side.      Popliteal pulses are 0 on the right side and 0 on the left side.       Dorsalis pedis pulses are 0 on the right side and 0 on the left side.       Posterior tibial pulses are 0 on the right side and 0 on the left side.  Bilateral feet ischemic appearing. Right foot superficial ulceration limited to skin.  Right foot is warm and non tender. No embolic complications. Trace edema noted. Left groin without hematoma  Pulmonary/Chest: Effort normal and breath sounds normal. No accessory muscle usage. No respiratory distress.  Abdominal: Soft. Bowel sounds are normal.  Vitals reviewed.   Labs:   Lab Results  Component Value Date   WBC 9.9 04/21/2019   HGB 10.4 (L) 04/21/2019   HCT 31.9 (L) 04/21/2019   MCV 90.1 04/21/2019   PLT 194 04/21/2019    Recent Labs  Lab 04/21/19 0104  NA 140  K 4.3  CL 109  CO2 24  BUN 19  CREATININE 1.15*  CALCIUM 8.6*  GLUCOSE 159*   Radiology: PERIPHERAL VASCULAR CATHETERIZATION  Result Date: 04/20/2019  Mid R CIA to Dist R CIA lesion is 30% stenosed.  Dist R Popliteal to Dist R Peroneal lesion is 100% stenosed with 100% stenosed side branch in Ost R ATA to Dist R ATA.  Ost R PTA to Prox R PTA lesion is 100% stenosed.  Mid R SFA to Dist R SFA lesion is 100% stenosed.  Post intervention, there is a 0% residual stenosis.  Balloon angioplasty was performed.  Prox R Popliteal to Dist R Popliteal lesion is 90% stenosed.  Post intervention, there is a 0% residual stenosis.  Balloon angioplasty was performed.  Peripheral  arteriogram 04/20/19 Findings: Distal abdominal aorta and aortoiliac bifurcation widely patent, bilateral common femoral arteries are widely patent. Right SFA is calcified and diffusely diseased, mid and distal segment is occluded.  It reconstitutes below the right knee via collaterals, right popliteal artery and TP trunk are tandem occluded. Successful atherectomy using Auryon 2.0 Solid state Laser of the right mid and distal SFA and Proximal Popliteal artery. Balloon PTCA of the Right TP trunk,  04/22/19, Popliteal and mid and distal SFA BALLOON COYOTE OTW [id number]. 100% to < 10%.  There was brisk flow evident all the way from the mid SFA on the right all the way up to TP trunk.  Below the right knee, there is no direct communication and vessels are free by collaterals.  Complications: Patient had vasovagal episode with nausea and vomiting and received 10 mg of intravenous hydralazine for hypertension at the same time leading to significant hypotension and seizure.  Patient was started on IV norepinephrine, stabilized blood pressure at 5 mcg/kg/min.  Total blood loss about 400-500 mL during the procedure due to aspiration during laser atherectomy.  Patient will be observed in the  intensive care tonight.  Will be discharged home in the morning.  I have discussed the procedure results and the complication that occurred in the cardiac catheterization lab with her daughter Lilygrace Rodick.  Left groin site was closed with Perclose with excellent hemostasis.   PCV CAROTID DUPLEX (BILATERAL)  Result Date: 04/11/2019 Carotid artery duplex  04/05/2019: Duplex suggests stenosis in the right internal carotid artery (16-49%). Stenosis in the right external carotid artery (<50%). Stenosis in the left external carotid artery (<50%). Antegrade right vertebral artery flow. Antegrade left vertebral artery flow. Follow up in one year is appropriate if clinically indicated. External carotid stenosis source of bruit.   PCV  LOWER ARTERIAL (BILATERAL)  Addendum Date: 04/11/2019   Lower Extremity Arterial Duplex 04/05/2019: Monophasic waveform throughout the right SFA suggests significant (>50% right proximal SFA stenosis). Monophasic waveform below the left knee suggests diffuse small vessel disease below the left knee. This exam reveals critically decreased perfusion of the right lower extremity, noted at the anterior tibial artery level (ABI 0.40) and moderately decreased perfusion of the left lower extremity, noted at the post tibial artery level (ABI 0.90).  Compared to 09/16/2018, bilateral critical decrease in ABI, right 0.40 and left 0.60 is unchanged. Consider further work up including angiography.   Result Date: 04/11/2019 Carotid artery duplex  04/05/2019: Stenosis in the left external carotid artery (<50%). Antegrade right vertebral artery flow. Antegrade left vertebral artery flow. Follow up in one year is appropriate if clinically indicated. External carotid stenosis source of bruit.  FOLLOW UP PLANS AND APPOINTMENTS  Allergies as of 04/21/2019   No Known Allergies     Medication List    STOP taking these medications   clopidogrel 75 MG tablet Commonly known as: PLAVIX     TAKE these medications   acetaminophen 500 MG tablet Commonly known as: TYLENOL Take 500 mg by mouth every 6 (six) hours as needed for moderate pain or headache.   aspirin EC 81 MG tablet Take 81 mg by mouth daily.   fosinopril 40 MG tablet Commonly known as: MONOPRIL Take 40 mg by mouth daily.   gabapentin 100 MG capsule Commonly known as: NEURONTIN Take 100-200 mg by mouth at bedtime as needed (burning in feet).   glimepiride 2 MG tablet Commonly known as: AMARYL Take 2-3 mg by mouth See admin instructions. Take 3 mg with breakfast and 2 mg at supper   hydrochlorothiazide 25 MG tablet Commonly known as: HYDRODIURIL Take 25 mg by mouth daily.   latanoprost 0.005 % ophthalmic solution Commonly known as:  XALATAN Place 1 drop into both eyes at bedtime.   metFORMIN 1000 MG tablet Commonly known as: GLUCOPHAGE Take 1 tablet (1,000 mg total) by mouth 2 (two) times daily with a meal.   metoprolol tartrate 50 MG tablet Commonly known as: LOPRESSOR Take 50 mg by mouth daily.   multivitamin with minerals Tabs tablet Take 1 tablet by mouth daily.   pravastatin 20 MG tablet Commonly known as: PRAVACHOL Take 20 mg by mouth daily.   rivaroxaban 2.5 MG Tabs tablet Commonly known as: XARELTO Take 1 tablet (2.5 mg total) by mouth 2 (two) times daily.      Follow-up Information    Yates Decamp, MD. Call.   Specialty: Cardiology Why: Keep previous appointment  Contact information: [address] [place] [address] [phone]          Yates Decamp, MD, [facility] 04/21/2019, 9:01 PM [facility] Office: [phone]

## 2019-04-23 ENCOUNTER — Ambulatory Visit: Payer: Medicare Other

## 2019-05-02 NOTE — Progress Notes (Signed)
Primary Physician:  Aretta Nip, MD   Patient ID: Angela Benitez, female    DOB: [birth year], 86 y.o.   MRN: [id number]  Subjective:    Chief Complaint  Patient presents with  . Peripheral artery disease    follow up PV angiogram and angioplasty  . Hypertension    HPI: Angela Benitez  is a 86 y.o. female  with hypertension, diabetes mellitus type 2 and hypercholesterolemia and PAD presents for post procedure f/u.  Due to severe symptoms of claudication, underwent peripheral arteriogram on 10/27/2018 with angioplasty to right SFA and in a staged fashion angioplasty to left SFA on 11/24/2018.  Few weeks ago she developed small ulceration involving the right foot and also pain in bilateral foot right worse than left especially at night.  She underwent peripheral arteriogram on 04/20/19 with successful angioplasty with Ayuron solid state laser atherectomy followed by balloon PTC of the right distal SFA and popliteal artery and TP trunk. She now presents for f/u.   She is presently doing well, and she has noticed warmth in her right foot, mild edema persisting the right foot.  No specific complaints today.  Past Medical History:  Diagnosis Date  . Claudication in peripheral vascular disease ([place]) 10/27/2018  . Diabetes mellitus without complication ([place])   . Hyperlipidemia   . Hypertension   . Mild mitral regurgitation   . Murmur   . Neuropathy   . PAD (peripheral artery disease) ([place])     Past Surgical History:  Procedure Laterality Date  . ABDOMINAL AORTOGRAM N/A 10/27/2018   Procedure: ABDOMINAL AORTOGRAM;  Surgeon: Nigel Mormon, MD;  Location: [facility];  Service: Cardiovascular;  Laterality: N/A;  . LOWER EXTREMITY ANGIOGRAPHY Bilateral 10/27/2018   Procedure: LOWER EXTREMITY ANGIOGRAPHY;  Surgeon: Nigel Mormon, MD;  Location: [facility];  Service: Cardiovascular;  Laterality: Bilateral;  . LOWER EXTREMITY ANGIOGRAPHY Right 11/24/2018   Procedure: LOWER EXTREMITY ANGIOGRAPHY;  Surgeon: Nigel Mormon, MD;  Location: [facility];  Service: Cardiovascular;  Laterality: Right;  . LOWER EXTREMITY ANGIOGRAPHY  04/20/2019   Procedure: Lower Extremity Angiography;  Surgeon: Adrian Prows, MD;  Location: [facility];  Service: Cardiovascular;;  Bilateral limited study  . PERIPHERAL VASCULAR ATHERECTOMY Left 10/27/2018   Procedure: PERIPHERAL VASCULAR ATHERECTOMY;  Surgeon: Nigel Mormon, MD;  Location: [facility];  Service: Cardiovascular;  Laterality: Left;  SFA with DRUG COATED BALLOON  . PERIPHERAL VASCULAR ATHERECTOMY Right 11/24/2018   Procedure: PERIPHERAL VASCULAR ATHERECTOMY;  Surgeon: Nigel Mormon, MD;  Location: [facility];  Service: Cardiovascular;  Laterality: Right;  fem/pop  . PERIPHERAL VASCULAR BALLOON ANGIOPLASTY Right 11/24/2018   Procedure: PERIPHERAL VASCULAR BALLOON ANGIOPLASTY;  Surgeon: Nigel Mormon, MD;  Location: [facility];  Service: Cardiovascular;  Laterality: Right;  fem/pop peroneal  . PERIPHERAL VASCULAR BALLOON ANGIOPLASTY  04/20/2019   Procedure: PERIPHERAL VASCULAR BALLOON ANGIOPLASTY;  Surgeon: Adrian Prows, MD;  Location: [facility];  Service: Cardiovascular;;  With Laser treatment   Social History   Tobacco Use  . Smoking status: Never Smoker  . Smokeless tobacco: Never Used  Substance Use Topics  . Alcohol use: No    Marital status: Widowed   Review of Systems  Cardiovascular: Positive for claudication and leg swelling (right foot). Negative for chest pain and dyspnea on exertion.  Gastrointestinal: Negative for melena.   Objective:   Vitals with BMI 05/03/2019 05/03/2019 04/21/2019  Height - 5' 3"  -  Weight - 130 lbs -  BMI - 13.14 -  Systolic [phone]  Diastolic 80 62 282  Pulse - 69 99      Physical Exam  Constitutional: Vital signs are normal. She appears well-developed and well-nourished.  Cardiovascular: Normal  rate, regular rhythm and intact distal pulses.  Murmur heard.  Early systolic murmur is present with a grade of 1/6 at the upper right sternal border. Pulses:      Carotid pulses are on the left side with bruit.      Femoral pulses are 1+ on the right side and 2+ on the left side.      Popliteal pulses are 0 on the right side and 0 on the left side.       Dorsalis pedis pulses are 0 on the right side and 0 on the left side.       Posterior tibial pulses are 0 on the right side and 0 on the left side.  Bilateral feet ischemic appearing.  Right foot superficial ulceration limited to skin. Dry and healing. Foot warm to touch.  Trace right ankle edema.   Pulmonary/Chest: Effort normal and breath sounds normal. No accessory muscle usage. No respiratory distress.  Abdominal: Soft. Bowel sounds are normal.  Vitals reviewed.  Radiology: No results found.  Laboratory examination:    CMP Latest Ref Rng & Units 04/21/2019 04/15/2019 11/24/2018  Glucose 70 - 99 mg/dL 159(H) 58(L) 165(H)  BUN 8 - 23 mg/dL 19 18 20   Creatinine 0.44 - 1.00 mg/dL 1.15(H) 1.10(H) 0.70  Sodium 135 - 145 mmol/L 140 145(H) 140  Potassium 3.5 - 5.1 mmol/L 4.3 4.5 3.8  Chloride 98 - 111 mmol/L 109 108(H) 106  CO2 22 - 32 mmol/L 24 23 -  Calcium 8.9 - 10.3 mg/dL 8.6(L) 9.9 -   CBC Latest Ref Rng & Units 04/21/2019 04/20/2019 04/15/2019  WBC 4.0 - 10.5 K/uL 9.9 9.3 7.8  Hemoglobin 12.0 - 15.0 g/dL 10.4(L) 11.4(L) 13.1  Hematocrit 36.0 - 46.0 % 31.9(L) 35.7(L) 41.3  Platelets 150 - 400 K/uL 194 242 245   Lipid Panel  No results found for: CHOL, TRIG, HDL, CHOLHDL, VLDL, LDLCALC, LDLDIRECT HEMOGLOBIN A1C No results found for: HGBA1C, MPG TSH No results for input(s): TSH in the last 8760 hours.  PRN Meds:. There are no discontinued medications. Current Meds  Medication Sig  . acetaminophen (TYLENOL) 500 MG tablet Take 500 mg by mouth every 6 (six) hours as needed for moderate pain or headache.  Marland Kitchen aspirin EC 81 MG  tablet Take 81 mg by mouth daily.  . fosinopril (MONOPRIL) 40 MG tablet Take 40 mg by mouth daily.   Marland Kitchen gabapentin (NEURONTIN) 100 MG capsule Take 100-200 mg by mouth at bedtime as needed (burning in feet).  Marland Kitchen glimepiride (AMARYL) 2 MG tablet Take 2-3 mg by mouth See admin instructions. Take 3 mg with breakfast and 2 mg at supper  . hydrochlorothiazide (HYDRODIURIL) 25 MG tablet Take 25 mg by mouth daily.   Marland Kitchen latanoprost (XALATAN) 0.005 % ophthalmic solution Place 1 drop into both eyes at bedtime.   . metFORMIN (GLUCOPHAGE) 1000 MG tablet Take 1 tablet (1,000 mg total) by mouth 2 (two) times daily with a meal.  . metoprolol tartrate (LOPRESSOR) 50 MG tablet Take 50 mg by mouth daily.   . Multiple Vitamin (MULTIVITAMIN WITH MINERALS) TABS tablet Take 1 tablet by mouth daily.  . pravastatin (PRAVACHOL) 20 MG tablet Take 20  mg by mouth daily.   . rivaroxaban (XARELTO) 2.5 MG TABS tablet Take 1 tablet (2.5 mg total) by mouth 2 (two) times daily.    Cardiac Studies:   Echocardiogram 05/02/2016: Left ventricle cavity is small. Cavitary obliteration noted and suspect intraventricular PG. Mild concentric remodeling of the left ventricle. Basal septal hypertrophy. Normal global wall motion. Doppler evidence of grade II (pseudonormal) diastolic dysfunction, elevated LAP. Calculated EF 74%. Mild (Grade I) mitral regurgitation. No evidence of mitral valve stenosis. Moderate tricuspid regurgitation. Mild pulmonary hypertension. Pulmonary artery systolic pressure is estimated at 33 mm Hg. Mild pulmonic regurgitation.  Peripheral arteriogram 10/27/2018:  Right SFA tandem 95% mid stenosis, 1%) atrial artery occlusion.  One-vessel runoff in the form of right anterior tibial.  Successful PTCA with 5.0 X 200 mm In.Pact DCB left distal superficial femoral artery/proximal left poppliteal artery and 5.0 X 60 mm In.Pact DCB left proximal superficial femoral artery      11/24/2018: Diffuse disease in the left SFA  from 60-95% stenosis, diffuse disease and popliteal vessel up to 40%, occluded left DP trunk, patent left anterior tibial with distal 100% occlusion at the ankle. Right SFA: Tandem 95% stenoses right mid SFA Subtotally occluded rt popliteal artery/ Rt TP trunk, with reconstitution of all three vessels below the knee with diffuse distal disease. Atherectomy (1.5 solid [place]) Rt mid to distal SFA, Rt popliteal artery, Rt TP trunk PTA 3.0 X 200 mm balloon Rt common peroneal artery to Rt mid SFA PTA 5.0 X 150 mm balloon Rt mid to distal SFA 10% residual stenosis  ABI 11/25/2018: Right: Resting right ankle-brachial index indicates noncompressible right lower extremity arteries. The right toe-brachial index is abnormal. Non compressible and ABIs are unreliable. Monophasic ABI 1.15. snd TBI 0.25   Left: Resting left ankle-brachial index indicates mild left lower extremity arterial disease. The left toe-brachial index is abnormal. Monophasic ABI 0.86 and TBI 0.25 Compared to 09/16/18, Right ABI 0.40 and Left ABI 0.60.  Carotid artery duplex 04/05/2019:  Duplex suggests stenosis in the right internal carotid artery (16-49%).  Stenosis in the right external carotid artery (<50%).  Stenosis in the left external carotid artery (<50%).  Antegrade right vertebral artery flow. Antegrade left vertebral artery flow.  Follow up in one year is appropriate if clinically indicated. External carotid stenosis source of bruit.  Lower Extremity Arterial Duplex 04/05/2019:  Monophasic waveform throughout the right SFA suggests significant (>50%  right proximal SFA stenosis).  Monophasic waveform below the left knee suggests diffuse small vessel  disease below the left knee.  This exam reveals critically decreased perfusion of the right lower  extremity, noted at the anterior tibial artery level (ABI 0.40) and  moderately decreased perfusion of the left lower extremity, noted at the  post tibial artery  level (ABI 0.90).  Compared to 09/16/2018, bilateral  critical decrease in ABI, right 0.40 and left 0.60 is unchanged. However post procedure ABI on 11/25/2018 was 1.15 right and 0.86 left. Consider further work up including angiography.  Peripheral arteriogram 04/20/19  Findings: Distal abdominal aorta and aortoiliac bifurcation widely patent, bilateral common femoral arteries are widely patent. Right SFA is calcified and diffusely diseased, mid and distal segment is occluded.  It reconstitutes below the right knee via collaterals, right popliteal artery and TP trunk are tandem occluded.  Successful atherectomy using [place] 2.0 Solid state Laser of the right mid and distal SFA and Proximal Popliteal artery. Balloon PTCA of the Right TP trunk,  [id number], Popliteal and mid  and distal SFA BALLOON COYOTE OTW [id number].  100% to < 10%.  There was brisk flow evident all the way from the mid SFA on the right all the way up to TP trunk.  Below the right knee, there is no direct communication and vessels are free by collaterals.  Complications: Patient had vasovagal episode with nausea and vomiting and received 10 mg of intravenous hydralazine for hypertension at the same time leading to significant hypotension and seizure.  Patient was started on IV norepinephrine, stabilized blood pressure at 5 mcg/kg/min.  Total blood loss about 400-500 mL during the procedure due to aspiration during laser atherectomy.  Patient will be observed in the intensive care tonight.  Will be discharged home in the morning.  I have discussed the procedure results and the complication that occurred in the cardiac catheterization lab with her daughter Airianna Kreischer.  Left groin site was closed with Perclose with excellent hemostasis.  EKG:  EKG 05/03/2019: Normal sinus rhythm with rate of 65 bpm, normal axis.  No evidence of ischemia.  PACs (2).   No significant change from EKG 04/15/2019   Assessment:     ICD-10-CM     1. Critical limb ischemia with history of revascularization of same extremity  I99.8 EKG 12-Lead   Z95.9 PCV ANKLE BRACHIAL INDEX (ABI)    lidocaine (XYLOCAINE) 5 % ointment  2. PAD (peripheral artery disease) ([facility])  I73.9 PCV ANKLE BRACHIAL INDEX (ABI)  3. Ulcer of right foot, limited to breakdown of skin ([place])  L97.511   4. Asymptomatic bilateral carotid artery stenosis  I65.23     EKG 07/13/2018: Sinus rhythm, within normal limits.  Recommendations:   Angela Benitez  is a 86 y.o. with hypertension, diabetes mellitus type 2 and hypercholesterolemia and PAD presents for post procedure f/u.  Due to severe symptoms of claudication, underwent peripheral arteriogram on 10/27/2018 with angioplasty to right SFA and in a staged fashion angioplasty to left SFA on 11/24/2018.  Few weeks ago she developed small ulceration involving the right foot and also pain in bilateral foot right worse than left especially at night.  She underwent peripheral arteriogram on 04/20/19 with successful angioplasty with Ayuron solid state laser atherectomy followed by balloon PTC of the right distal SFA and popliteal artery and TP trunk. She now presents for f/u.  I started on Xarelto 2.5 mg p.o. twice daily, patient is presently on aspirin, I also discontinued Plavix after angioplasty.  In view of severe small vessel disease ventricular ischemia, would recommend continuing Xarelto probably long-term to avoid limb loss.  Right foot is now warm, does not appear to be as "angry", all the superficial ulcerations appear to be dried up and appear to be healing.  Will obtain ABI.  I will see her back with repeat ABI in 3 months.  She will contact me if she were to develop any new ulceration or worsening leg pain.  With regard to carotid artery disease, she has mild disease, will continue surveillance. Blood pressure is not well controlled, lipids are also well controlled.  Continue present medical therapy.   Adrian Prows, MD,  [facility] 05/03/2019, 10:53 AM [facility] [place] Office: [phone]

## 2019-05-03 ENCOUNTER — Other Ambulatory Visit: Payer: Self-pay

## 2019-05-03 ENCOUNTER — Encounter: Payer: Self-pay | Admitting: Cardiology

## 2019-05-03 ENCOUNTER — Ambulatory Visit: Payer: Medicare Other | Admitting: Cardiology

## 2019-05-03 VITALS — BP 132/80 | HR 69 | Temp 97.7°F | Ht 63.0 in | Wt 130.0 lb

## 2019-05-03 DIAGNOSIS — Z9889 Other specified postprocedural states: Secondary | ICD-10-CM

## 2019-05-03 DIAGNOSIS — L97511 Non-pressure chronic ulcer of other part of right foot limited to breakdown of skin: Secondary | ICD-10-CM

## 2019-05-03 DIAGNOSIS — I998 Other disorder of circulatory system: Secondary | ICD-10-CM | POA: Diagnosis not present

## 2019-05-03 DIAGNOSIS — I6523 Occlusion and stenosis of bilateral carotid arteries: Secondary | ICD-10-CM

## 2019-05-03 DIAGNOSIS — I739 Peripheral vascular disease, unspecified: Secondary | ICD-10-CM | POA: Diagnosis not present

## 2019-05-03 DIAGNOSIS — Z959 Presence of cardiac and vascular implant and graft, unspecified: Secondary | ICD-10-CM | POA: Diagnosis not present

## 2019-05-03 DIAGNOSIS — I70229 Atherosclerosis of native arteries of extremities with rest pain, unspecified extremity: Secondary | ICD-10-CM

## 2019-05-03 MED ORDER — LIDOCAINE 5 % EX OINT: 1.0000 "application " | TOPICAL_OINTMENT | CUTANEOUS | 0 refills | Status: DC | PRN

## 2019-05-07 ENCOUNTER — Ambulatory Visit: Payer: Medicare Other

## 2019-05-07 ENCOUNTER — Other Ambulatory Visit: Payer: Self-pay

## 2019-05-07 DIAGNOSIS — Z9889 Other specified postprocedural states: Secondary | ICD-10-CM

## 2019-05-07 DIAGNOSIS — I998 Other disorder of circulatory system: Secondary | ICD-10-CM

## 2019-05-07 DIAGNOSIS — I739 Peripheral vascular disease, unspecified: Secondary | ICD-10-CM

## 2019-06-01 DIAGNOSIS — H401132 Primary open-angle glaucoma, bilateral, moderate stage: Secondary | ICD-10-CM | POA: Diagnosis not present

## 2019-06-03 NOTE — Progress Notes (Signed)
Primary Physician/Referring:  Aretta Nip, MD  Patient ID: Angela Benitez, female    DOB: [birth year], 86 y.o.   MRN: [id number]  Chief Complaint  Patient presents with  . PAD  . Results    Duplex and Carotid  . Follow-up    3 month   HPI:    Angela Benitez  is a 86 y.o. AA female  with hypertension, diabetes mellitus type 2 and hypercholesterolemia and PAD presents for post procedure f/u.  Due to severe symptoms of claudication, underwent peripheral arteriogram on 10/27/2018 with angioplasty to right SFA and in a staged fashion angioplasty to left SFA on 11/24/2018.  Few weeks ago she developed small ulceration involving the right foot and also pain in bilateral foot right worse than left especially at night.  She underwent peripheral arteriogram on 04/20/19 with successful angioplasty with Ayuron solid state laser atherectomy followed by balloon PTC of the right distal SFA and popliteal artery and TP trunk. She now presents for f/u.   She is presently doing well, and she has noticed warmth in her right foot, ulcer has completely healed.  Except for minimal symptoms of leg cramps occasionally, states that she is doing well and is also sleeping well without leg pain.  Past Medical History:  Diagnosis Date  . Claudication in peripheral vascular disease ([place]) 10/27/2018  . Diabetes mellitus without complication ([place])   . Hyperlipidemia   . Hypertension   . Mild mitral regurgitation   . Murmur   . Neuropathy   . PAD (peripheral artery disease) ([place])    Past Surgical History:  Procedure Laterality Date  . ABDOMINAL AORTOGRAM N/A 10/27/2018   Procedure: ABDOMINAL AORTOGRAM;  Surgeon: Nigel Mormon, MD;  Location: [facility];  Service: Cardiovascular;  Laterality: N/A;  . LOWER EXTREMITY ANGIOGRAPHY Bilateral 10/27/2018   Procedure: LOWER EXTREMITY ANGIOGRAPHY;  Surgeon: Nigel Mormon, MD;  Location: [facility];  Service: Cardiovascular;  Laterality:  Bilateral;  . LOWER EXTREMITY ANGIOGRAPHY Right 11/24/2018   Procedure: LOWER EXTREMITY ANGIOGRAPHY;  Surgeon: Nigel Mormon, MD;  Location: [facility];  Service: Cardiovascular;  Laterality: Right;  . LOWER EXTREMITY ANGIOGRAPHY  04/20/2019   Procedure: Lower Extremity Angiography;  Surgeon: Adrian Prows, MD;  Location: [facility];  Service: Cardiovascular;;  Bilateral limited study  . PERIPHERAL VASCULAR ATHERECTOMY Left 10/27/2018   Procedure: PERIPHERAL VASCULAR ATHERECTOMY;  Surgeon: Nigel Mormon, MD;  Location: [facility];  Service: Cardiovascular;  Laterality: Left;  SFA with DRUG COATED BALLOON  . PERIPHERAL VASCULAR ATHERECTOMY Right 11/24/2018   Procedure: PERIPHERAL VASCULAR ATHERECTOMY;  Surgeon: Nigel Mormon, MD;  Location: [facility];  Service: Cardiovascular;  Laterality: Right;  fem/pop  . PERIPHERAL VASCULAR BALLOON ANGIOPLASTY Right 11/24/2018   Procedure: PERIPHERAL VASCULAR BALLOON ANGIOPLASTY;  Surgeon: Nigel Mormon, MD;  Location: [facility];  Service: Cardiovascular;  Laterality: Right;  fem/pop peroneal  . PERIPHERAL VASCULAR BALLOON ANGIOPLASTY  04/20/2019   Procedure: PERIPHERAL VASCULAR BALLOON ANGIOPLASTY;  Surgeon: Adrian Prows, MD;  Location: [facility];  Service: Cardiovascular;;  With Laser treatment   Family History  Problem Relation Age of Onset  . Stroke Father   . Lung cancer Son     Social History   Tobacco Use  . Smoking status: Never Smoker  . Smokeless tobacco: Never Used  Substance Use Topics  . Alcohol use: No   Marital Status: Widowed  ROS  Review of Systems  Cardiovascular: Positive for claudication. Negative for chest pain, dyspnea on exertion and leg swelling.  Musculoskeletal: Negative for joint swelling.  Gastrointestinal: Negative for melena.   Objective  Blood pressure 128/70, pulse 66, temperature 97.9 F (36.6 C), temperature source Temporal, resp. rate 16,  height 5' 3"  (1.6 m), weight 135 lb (61.2 kg), SpO2 98 %.  Vitals with BMI 06/04/2019 05/03/2019 05/03/2019  Height 5' 3"  - 5' 3"   Weight 135 lbs - 130 lbs  BMI 84.53 - 64.68  Systolic 032 122 482  Diastolic 70 80 62  Pulse 66 - 69     Physical Exam  Constitutional: She appears well-developed and well-nourished. No distress.  Cardiovascular: Normal rate, regular rhythm and intact distal pulses. Exam reveals no gallop.  Murmur heard.  Systolic murmur is present with a grade of 1/6 at the upper right sternal border. Pulses:      Carotid pulses are on the left side with bruit.      Femoral pulses are 1+ on the right side and 2+ on the left side.      Popliteal pulses are 0 on the right side and 0 on the left side.       Dorsalis pedis pulses are 0 on the right side and 0 on the left side.       Posterior tibial pulses are 0 on the right side and 0 on the left side.  Bilateral feet ischemic appearing.  Right foot superficial ulceration limited to skin healed. Right foot is cooler than left  Pulmonary/Chest: Effort normal and breath sounds normal. No accessory muscle usage. No respiratory distress.  Abdominal: Soft.   Laboratory examination:   Recent Labs    11/11/18 0936 11/24/18 0615 11/24/18 1437 04/15/19 1311 04/21/19 0104  NA 143   < > 140 145* 140  K 4.3   < > 3.8 4.5 4.3  CL 101   < > 106 108* 109  CO2 26  --   --  23 24  GLUCOSE 128*   < > 165* 58* 159*  BUN 21   < > 20 18 19   CREATININE 1.29*   < > 0.70 1.10* 1.15*  CALCIUM 10.7*  --   --  9.9 8.6*  GFRNONAA 38*  --   --  46* 44*  GFRAA 44*  --   --  53* 51*   < > = values in this interval not displayed.   CrCl cannot be calculated (Patient's most recent lab result is older than the maximum 21 days allowed.).  CMP Latest Ref Rng & Units 04/21/2019 04/15/2019 11/24/2018  Glucose 70 - 99 mg/dL 159(H) 58(L) 165(H)  BUN 8 - 23 mg/dL 19 18 20   Creatinine 0.44 - 1.00 mg/dL 1.15(H) 1.10(H) 0.70  Sodium 135 - 145 mmol/L 140  145(H) 140  Potassium 3.5 - 5.1 mmol/L 4.3 4.5 3.8  Chloride 98 - 111 mmol/L 109 108(H) 106  CO2 22 - 32 mmol/L 24 23 -  Calcium 8.9 - 10.3 mg/dL 8.6(L) 9.9 -   CBC Latest Ref Rng & Units 04/21/2019 04/20/2019 04/15/2019  WBC 4.0 - 10.5 K/uL 9.9 9.3 7.8  Hemoglobin 12.0 - 15.0 g/dL 10.4(L) 11.4(L) 13.1  Hematocrit 36.0 - 46.0 % 31.9(L) 35.7(L) 41.3  Platelets 150 - 400 K/uL 194 242 245   Lipid Panel  No results found for: CHOL, TRIG, HDL, CHOLHDL, VLDL, LDLCALC, LDLDIRECT HEMOGLOBIN A1C No results found for: HGBA1C, MPG TSH No results for input(s): TSH  in the last 8760 hours.  External labs:   12/14/2018:  Lipid Panel completed 12/14/2018 HDL 58.000 12/14/2018 LDL 84.000 12/14/2018 Cholesterol, total 161.000 12/14/2018 Triglycerides 94.000 12/14/2018  HbA1c 6.3.   10/01/2016: TSH 0.580  Medications and allergies  No Known Allergies   Current Outpatient Medications  Medication Instructions  . acetaminophen (TYLENOL) 500 mg, Oral, Every 6 hours PRN  . aspirin EC 81 mg, Oral, Daily  . fosinopril (MONOPRIL) 40 mg, Oral, Daily  . gabapentin (NEURONTIN) 100-200 mg, Oral, At bedtime PRN  . glimepiride (AMARYL) 2-3 mg, Oral, See admin instructions, Take 3 mg with breakfast and 2 mg at supper  . hydrochlorothiazide (HYDRODIURIL) 25 mg, Oral, Daily  . latanoprost (XALATAN) 0.005 % ophthalmic solution 1 drop, Both Eyes, Daily at bedtime  . lidocaine (XYLOCAINE) 5 % ointment 1 application, Topical, As needed, Right foot as needed for pain  . metFORMIN (GLUCOPHAGE) 1,000 mg, Oral, 2 times daily with meals  . metoprolol tartrate (LOPRESSOR) 50 mg, Oral, Daily  . Multiple Vitamin (MULTIVITAMIN WITH MINERALS) TABS tablet 1 tablet, Oral, Daily  . nitroGLYCERIN (NITRODUR - DOSED IN MG/24 HR) 0.3 mg, Transdermal, Daily, Right foot  . pravastatin (PRAVACHOL) 20 mg, Oral, Daily  . rivaroxaban (XARELTO) 2.5 mg, Oral, 2 times daily   Radiology:   No results found.  Cardiac Studies:    Echocardiogram 05/02/2016: Left ventricle cavity is small. Cavitary obliteration noted and suspect intraventricular PG. Mild concentric remodeling of the left ventricle. Basal septal hypertrophy. Normal global wall motion. Doppler evidence of grade II (pseudonormal) diastolic dysfunction, elevated LAP. Calculated EF 74%. Mild (Grade I) mitral regurgitation. No evidence of mitral valve stenosis. Moderate tricuspid regurgitation. Mild pulmonary hypertension. Pulmonary artery systolic pressure is estimated at 33 mm Hg. Mild pulmonic regurgitation.  Peripheral arteriogram 10/27/2018:  Right SFA tandem 95% mid stenosis, 1%) atrial artery occlusion.  One-vessel runoff in the form of right anterior tibial.  Successful PTCA with 5.0 X 200 mm In.Pact DCB left distal superficial femoral artery/proximal left poppliteal artery and 5.0 X 60 mm In.Pact DCB left proximal superficial femoral artery      11/24/2018: Diffuse disease in the left SFA from 60-95% stenosis, diffuse disease and popliteal vessel up to 40%, occluded left DP trunk, patent left anterior tibial with distal 100% occlusion at the ankle. Right SFA: Tandem 95% stenoses right mid SFA Subtotally occluded rt popliteal artery/ Rt TP trunk, with reconstitution of all three vessels below the knee with diffuse distal disease. Atherectomy (1.5 solid [place]back) Rt mid to distal SFA, Rt popliteal artery, Rt TP trunk PTA 3.0 X 200 mm balloon Rt common peroneal artery to Rt mid SFA PTA 5.0 X 150 mm balloon Rt mid to distal SFA 10% residual stenosis  ABI 11/25/2018: Right: Resting right ankle-brachial index indicates noncompressible right lower extremity arteries. The right toe-brachial index is abnormal. Non compressible and ABIs are unreliable. Monophasic ABI 1.15. snd TBI 0.25  Left: Resting left ankle-brachial index indicates mild left lower extremity arterial disease. The left toe-brachial index is abnormal. Monophasic ABI 0.86 and TBI  0.25 Compared to 09/16/18, Right ABI 0.40 and Left ABI 0.60.  Carotid artery duplex 04/05/2019:  Duplex suggests stenosis in the right internal carotid artery (16-49%).  Stenosis in the right external carotid artery (<50%).  Stenosis in the left external carotid artery (<50%).  Antegrade right vertebral artery flow. Antegrade left vertebral artery flow.  Follow up in one year is appropriate if clinically indicated. External carotid stenosis source of bruit.  Lower Extremity Arterial Duplex 04/05/2019:  Monophasic waveform throughout the right SFA suggests significant (>50%  right proximal SFA stenosis).  Monophasic waveform below the left knee suggests diffuse small vessel  disease below the left knee.  This exam reveals critically decreased perfusion of the right lower  extremity, noted at the anterior tibial artery level (ABI 0.40) and  moderately decreased perfusion of the left lower extremity, noted at the  post tibial artery level (ABI 0.90).  Compared to 09/16/2018, bilateral  critical decrease in ABI, right 0.40 and left 0.60 is unchanged. However post procedure ABI on 11/25/2018 was 1.15 right and 0.86 left. Consider further work up including angiography.  Peripheral arteriogram 04/20/19: Findings: Distal abdominal aorta and aortoiliac bifurcation widely patent, bilateral common femoral arteries are widely patent. Right SFA is calcified and diffusely diseased, mid and distal segment is occluded.  It reconstitutes below the right knee via collaterals, right popliteal artery and TP trunk are tandem occluded.  Successful atherectomy using [place] 2.0 Solid state Laser of the right mid and distal SFA and Proximal Popliteal artery. Balloon PTCA of the Right TP trunk,  [id number], Popliteal and mid and distal SFA BALLOON COYOTE OTW [id number].  100% to < 10%.  There was brisk flow evident all the way from the mid SFA on the right all the way up to TP trunk.  Below the right knee, there is no  direct communication and vessels are free by collaterals.  Complications: Patient had vasovagal episode with nausea and vomiting and received 10 mg of intravenous hydralazine for hypertension at the same time leading to significant hypotension and seizure.  Patient was started on IV norepinephrine, stabilized blood pressure at 5 mcg/kg/min.  ABI 05/07/2019:  This exam reveals mildly decreased perfusion of the right lower extremity, noted at the post tibial artery level (ABI 0.89) and mildly decreased perfusion of the left lower extremity, noted at the dorsalis pedis artery level (ABI 0.81).  Compared to 04/05/2019, right ABI 0.40 and left ABI 0.90.  Study suggests improvement in the right ABI post angioplasty.  EKG    05/03/2019: Normal sinus rhythm with rate of 65 bpm, normal axis.  No evidence of ischemia.  PACs (2).   No significant change from EKG 04/15/2019  07/13/2018: Sinus rhythm, within normal limits.  Assessment     ICD-10-CM   1. Critical limb ischemia with history of revascularization of same extremity  I99.8 nitroGLYCERIN (NITRODUR - DOSED IN MG/24 HR) 0.3 mg/hr patch   Z95.9   2. PAD (peripheral artery disease) ([facility])  I73.9 nitroGLYCERIN (NITRODUR - DOSED IN MG/24 HR) 0.3 mg/hr patch    PCV ANKLE BRACHIAL INDEX (ABI)    CANCELED: PCV ANKLE BRACHIAL INDEX (ABI)  3. Ulcer of right foot, limited to breakdown of skin ([place])  L97.511   4. Asymptomatic bilateral carotid artery stenosis  I65.23      Meds ordered this encounter  Medications  . nitroGLYCERIN (NITRODUR - DOSED IN MG/24 HR) 0.3 mg/hr patch    Sig: Place 1 patch (0.3 mg total) onto the skin daily. Right foot    Dispense:  30 patch    Refill:  3    There are no discontinued medications.  Recommendations:   Angela Benitez  is a  86 y.o. with hypertension, diabetes mellitus type 2 and hypercholesterolemia and PAD presents for post procedure f/u.  Due to severe symptoms of claudication, underwent peripheral  arteriogram on 10/27/2018 with angioplasty to right SFA and in a staged fashion  angioplasty to left SFA on 11/24/2018.  Few weeks ago she developed small ulceration involving the right foot and also pain in bilateral foot right worse than left especially at night.  She underwent peripheral arteriogram on 04/20/19 with successful angioplasty with Ayuron solid state laser atherectomy followed by balloon PTC of the right distal SFA and popliteal artery and TP trunk. She now presents for f/u.  Her right foot is colder than previously examined, however her ulcers are completely healed.  She is also having minimal symptoms of claudication hence continue medical therapy and observation, continue aspirin and Xarelto 2.5 mg twice daily, I also prescribed her Nitro-Dur patches to her right foot.  Unless she develops any ulceration or develops symptoms of claudication I have advised her and her daughter to call me immediately for further evaluation and possible need for repeat angiography for limb salvage.  I will see her back in 6 months.  Patient and family are very reliable.  Blood pressure is well controlled, labs are stable.  Adrian Prows, MD, [facility] 06/04/2019, 9:57 AM [facility] Pager: [phone] Office: [phone]

## 2019-06-04 ENCOUNTER — Ambulatory Visit: Payer: Medicare Other | Admitting: Cardiology

## 2019-06-04 ENCOUNTER — Encounter: Payer: Self-pay | Admitting: Cardiology

## 2019-06-04 ENCOUNTER — Other Ambulatory Visit: Payer: Self-pay

## 2019-06-04 VITALS — BP 128/70 | HR 66 | Temp 97.9°F | Resp 16 | Ht 63.0 in | Wt 135.0 lb

## 2019-06-04 DIAGNOSIS — L97511 Non-pressure chronic ulcer of other part of right foot limited to breakdown of skin: Secondary | ICD-10-CM

## 2019-06-04 DIAGNOSIS — I739 Peripheral vascular disease, unspecified: Secondary | ICD-10-CM

## 2019-06-04 DIAGNOSIS — Z9889 Other specified postprocedural states: Secondary | ICD-10-CM

## 2019-06-04 DIAGNOSIS — I998 Other disorder of circulatory system: Secondary | ICD-10-CM | POA: Diagnosis not present

## 2019-06-04 DIAGNOSIS — Z959 Presence of cardiac and vascular implant and graft, unspecified: Secondary | ICD-10-CM | POA: Diagnosis not present

## 2019-06-04 DIAGNOSIS — I6523 Occlusion and stenosis of bilateral carotid arteries: Secondary | ICD-10-CM

## 2019-06-04 MED ORDER — NITROGLYCERIN 0.3 MG/HR TD PT24: 0.3000 mg | MEDICATED_PATCH | Freq: Every day | TRANSDERMAL | 3 refills | Status: DC

## 2019-06-08 DIAGNOSIS — I998 Other disorder of circulatory system: Secondary | ICD-10-CM

## 2019-06-08 DIAGNOSIS — I70229 Atherosclerosis of native arteries of extremities with rest pain, unspecified extremity: Secondary | ICD-10-CM

## 2019-06-08 MED ORDER — NITROGLYCERIN 0.4 MG/HR TD PT24: 0.4000 mg | MEDICATED_PATCH | Freq: Every day | TRANSDERMAL | 2 refills | Status: DC

## 2019-06-08 NOTE — Telephone Encounter (Signed)
Per Dr. Verl Dicker recommendation, changed the dose to nitroglycerin 0.4 mg/hr formulation. Available in generic formulation. Called pharmacy to get an updated quote of $69.08 for 1 month supply. Called pt to confirm if she was okay with the price. Pt stated that she will go ahead with the new Rx and try it for a couple of weeks to see if there is any improvements in her symptoms. Reviewed administration instructions with the patients on how to apply the transdermal patch.

## 2019-06-16 DIAGNOSIS — I10 Essential (primary) hypertension: Secondary | ICD-10-CM | POA: Diagnosis not present

## 2019-06-16 DIAGNOSIS — N183 Chronic kidney disease, stage 3 unspecified: Secondary | ICD-10-CM | POA: Diagnosis not present

## 2019-06-16 DIAGNOSIS — E7800 Pure hypercholesterolemia, unspecified: Secondary | ICD-10-CM | POA: Diagnosis not present

## 2019-06-16 DIAGNOSIS — E1142 Type 2 diabetes mellitus with diabetic polyneuropathy: Secondary | ICD-10-CM | POA: Diagnosis not present

## 2019-06-17 ENCOUNTER — Other Ambulatory Visit: Payer: Self-pay

## 2019-06-17 ENCOUNTER — Other Ambulatory Visit: Payer: Self-pay | Admitting: Cardiology

## 2019-06-17 DIAGNOSIS — Z9889 Other specified postprocedural states: Secondary | ICD-10-CM

## 2019-06-23 DIAGNOSIS — E7800 Pure hypercholesterolemia, unspecified: Secondary | ICD-10-CM | POA: Diagnosis not present

## 2019-06-23 DIAGNOSIS — E1142 Type 2 diabetes mellitus with diabetic polyneuropathy: Secondary | ICD-10-CM | POA: Diagnosis not present

## 2019-06-23 DIAGNOSIS — Z7984 Long term (current) use of oral hypoglycemic drugs: Secondary | ICD-10-CM | POA: Diagnosis not present

## 2019-06-23 DIAGNOSIS — I10 Essential (primary) hypertension: Secondary | ICD-10-CM | POA: Diagnosis not present

## 2019-07-30 ENCOUNTER — Ambulatory Visit: Payer: Medicare Other

## 2019-07-30 ENCOUNTER — Other Ambulatory Visit: Payer: Self-pay

## 2019-07-30 DIAGNOSIS — I739 Peripheral vascular disease, unspecified: Secondary | ICD-10-CM

## 2019-08-06 ENCOUNTER — Ambulatory Visit: Payer: Medicare Other | Admitting: Cardiology

## 2019-08-09 ENCOUNTER — Encounter: Payer: Self-pay | Admitting: Cardiology

## 2019-08-09 ENCOUNTER — Ambulatory Visit: Payer: Medicare Other | Admitting: Cardiology

## 2019-08-09 ENCOUNTER — Other Ambulatory Visit: Payer: Self-pay

## 2019-08-09 VITALS — BP 140/66 | HR 72 | Resp 17 | Ht 63.0 in | Wt 132.0 lb

## 2019-08-09 DIAGNOSIS — I70229 Atherosclerosis of native arteries of extremities with rest pain, unspecified extremity: Secondary | ICD-10-CM

## 2019-08-09 DIAGNOSIS — I10 Essential (primary) hypertension: Secondary | ICD-10-CM

## 2019-08-09 DIAGNOSIS — I739 Peripheral vascular disease, unspecified: Secondary | ICD-10-CM

## 2019-08-09 MED ORDER — NITROGLYCERIN 0.4 MG/HR TD PT24: 0.4000 mg | MEDICATED_PATCH | Freq: Every day | TRANSDERMAL | 3 refills | Status: DC

## 2019-08-09 MED ORDER — XARELTO 2.5 MG PO TABS: 2.5000 mg | ORAL_TABLET | Freq: Two times a day (BID) | ORAL | 3 refills | Status: DC

## 2019-08-09 NOTE — Progress Notes (Signed)
Primary Physician/Referring:  Aretta Nip, MD  Patient ID: Angela Benitez, female    DOB: 1933-10-23, 86 y.o.   MRN: [id number]  Chief Complaint  Patient presents with  . Follow-up  . Circulatory Problem    peripheral artery disease   HPI:    Angela Benitez  is a 86 y.o. AA female  with hypertension, diabetes mellitus type 2 and hypercholesterolemia and PAD presents for post procedure f/u.  Due to severe symptoms of claudication, underwent peripheral arteriogram on 10/27/2018 with angioplasty to right SFA and in a staged fashion angioplasty to left SFA on 11/24/2018.  Few weeks ago she developed small ulceration involving the right foot and also pain in bilateral foot right worse than left especially at night.  She underwent peripheral arteriogram on 04/20/19 with successful angioplasty with Ayuron solid state laser atherectomy followed by balloon PTC of the right distal SFA and popliteal artery and TP trunk. She now presents for f/u.   I had seen her 2 months ago, she recently underwent ABI and due to marked abnormalities in the ABI I brought her in to reevaluate her symptoms. She is presently doing well, and she has noticed warmth in her right foot, ulcer has completely healed.  Except for minimal symptoms of leg cramps occasionally, states that she is doing well and is also sleeping well without leg pain.    Past Medical History:  Diagnosis Date  . Claudication in peripheral vascular disease ([place]) 10/27/2018  . Diabetes mellitus without complication ([place])   . Hyperlipidemia   . Hypertension   . Mild mitral regurgitation   . Murmur   . Neuropathy   . PAD (peripheral artery disease) ([place])    Past Surgical History:  Procedure Laterality Date  . ABDOMINAL AORTOGRAM N/A 10/27/2018   Procedure: ABDOMINAL AORTOGRAM;  Surgeon: Nigel Mormon, MD;  Location: [facility];  Service: Cardiovascular;  Laterality: N/A;  . LOWER EXTREMITY ANGIOGRAPHY Bilateral 10/27/2018    Procedure: LOWER EXTREMITY ANGIOGRAPHY;  Surgeon: Nigel Mormon, MD;  Location: [facility];  Service: Cardiovascular;  Laterality: Bilateral;  . LOWER EXTREMITY ANGIOGRAPHY Right 11/24/2018   Procedure: LOWER EXTREMITY ANGIOGRAPHY;  Surgeon: Nigel Mormon, MD;  Location: [facility];  Service: Cardiovascular;  Laterality: Right;  . LOWER EXTREMITY ANGIOGRAPHY  04/20/2019   Procedure: Lower Extremity Angiography;  Surgeon: Adrian Prows, MD;  Location: [facility];  Service: Cardiovascular;;  Bilateral limited study  . PERIPHERAL VASCULAR ATHERECTOMY Left 10/27/2018   Procedure: PERIPHERAL VASCULAR ATHERECTOMY;  Surgeon: Nigel Mormon, MD;  Location: [facility];  Service: Cardiovascular;  Laterality: Left;  SFA with DRUG COATED BALLOON  . PERIPHERAL VASCULAR ATHERECTOMY Right 11/24/2018   Procedure: PERIPHERAL VASCULAR ATHERECTOMY;  Surgeon: Nigel Mormon, MD;  Location: [facility];  Service: Cardiovascular;  Laterality: Right;  fem/pop  . PERIPHERAL VASCULAR BALLOON ANGIOPLASTY Right 11/24/2018   Procedure: PERIPHERAL VASCULAR BALLOON ANGIOPLASTY;  Surgeon: Nigel Mormon, MD;  Location: [facility];  Service: Cardiovascular;  Laterality: Right;  fem/pop peroneal  . PERIPHERAL VASCULAR BALLOON ANGIOPLASTY  04/20/2019   Procedure: PERIPHERAL VASCULAR BALLOON ANGIOPLASTY;  Surgeon: Adrian Prows, MD;  Location: [facility];  Service: Cardiovascular;;  With Laser treatment   Family History  Problem Relation Age of Onset  . Stroke Father   . Lung cancer Son     Social History   Tobacco Use  . Smoking status: Never Smoker  .  Smokeless tobacco: Never Used  Substance Use Topics  . Alcohol use: No   Marital Status: Widowed  ROS  Review of Systems  Cardiovascular: Positive for claudication. Negative for chest pain, dyspnea on exertion and leg swelling.  Musculoskeletal: Negative for joint swelling.  Gastrointestinal:  Negative for melena.   Objective  Blood pressure 140/66, pulse 72, resp. rate 17, height _0  (1.6 m), weight 132 lb (59.9 kg), SpO2 99 %.  Vitals with BMI 08/09/2019 06/04/2019 05/03/2019  Height _1  _2  -  Weight 132 lbs 135 lbs -  BMI 91.50 56.97 -  Systolic 948 016 553  Diastolic 66 70 80  Pulse 72 66 -     Physical Exam Constitutional:      General: She is not in acute distress.    Appearance: She is well-developed.  Cardiovascular:     Rate and Rhythm: Normal rate and regular rhythm.     Pulses: Intact distal pulses.          Carotid pulses are on the left side with bruit.      Femoral pulses are 1+ on the right side and 2+ on the left side.      Popliteal pulses are 0 on the right side and 0 on the left side.       Dorsalis pedis pulses are 0 on the right side and 0 on the left side.       Posterior tibial pulses are 0 on the right side and 0 on the left side.     Heart sounds: Murmur heard.  Systolic murmur is present with a grade of 1/6 at the upper right sternal border.  No gallop.      Comments: Bilateral feet ischemic appearing.  Right foot superficial ulceration limited to skin healed. Bilateral foot warm. Pulmonary:     Effort: Pulmonary effort is normal. No accessory muscle usage or respiratory distress.     Breath sounds: Normal breath sounds.  Abdominal:     Palpations: Abdomen is soft.    Laboratory examination:   Recent Labs    11/11/18 0936 11/24/18 0615 11/24/18 1437 04/15/19 1311 04/21/19 0104  NA 143   < > 140 145* 140  K 4.3   < > 3.8 4.5 4.3  CL 101   < > 106 108* 109  CO2 26  --   --  23 24  GLUCOSE 128*   < > 165* 58* 159*  BUN 21   < > _3 CREATININE 1.29*   < > 0.70 1.10* 1.15*  CALCIUM 10.7*  --   --  9.9 8.6*  GFRNONAA 38*  --   --  46* 44*  GFRAA 44*  --   --  53* 51*   < > = values in this interval not displayed.   CrCl cannot be calculated (Patient's most recent lab result is older than the maximum 21 days allowed.).    CMP Latest Ref Rng & Units 04/21/2019 04/15/2019 11/24/2018  Glucose 70 - 99 mg/dL 159(H) 58(L) 165(H)  BUN 8 - 23 mg/dL _4 Creatinine 0.44 - 1.00 mg/dL 1.15(H) 1.10(H) 0.70  Sodium 135 - 145 mmol/L 140 145(H) 140  Potassium 3.5 - 5.1 mmol/L 4.3 4.5 3.8  Chloride 98 - 111 mmol/L 109 108(H) 106  CO2 22 - 32 mmol/L 24 23 -  Calcium 8.9 - 10.3 mg/dL 8.6(L) 9.9 -   CBC Latest Ref Rng & Units 04/21/2019 04/20/2019 04/15/2019  WBC 4.0 - 10.5 K/uL 9.9 9.3 7.8  Hemoglobin 12.0 - 15.0 g/dL 10.4(L) 11.4(L) 13.1  Hematocrit 36 - 46 % 31.9(L) 35.7(L) 41.3  Platelets 150 - 400 K/uL 194 242 245   External labs:   12/14/2018:  Lipid Panel completed 12/14/2018 HDL 58.000 12/14/2018 LDL 84.000 12/14/2018 Cholesterol, total 161.000 12/14/2018 Triglycerides 94.000 12/14/2018  HbA1c 6.3.   10/01/2016: TSH 0.580  Medications and allergies  No Known Allergies   Current Outpatient Medications  Medication Instructions  . acetaminophen (TYLENOL) 500 mg, Oral, Every 6 hours PRN  . aspirin EC 81 mg, Oral, Daily  . fosinopril (MONOPRIL) 40 mg, Oral, Daily  . gabapentin (NEURONTIN) 100-200 mg, Oral, At bedtime PRN  . glimepiride (AMARYL) 2-3 mg, Oral, See admin instructions, Take 3 mg with breakfast and 2 mg at supper  . hydrochlorothiazide (HYDRODIURIL) 25 mg, Oral, Daily  . latanoprost (XALATAN) 0.005 % ophthalmic solution 1 drop, Both Eyes, Daily at bedtime  . lidocaine (XYLOCAINE) 5 % ointment 1 application, Topical, As needed, Right foot as needed for pain  . metFORMIN (GLUCOPHAGE) 1,000 mg, Oral, 2 times daily with meals  . metoprolol tartrate (LOPRESSOR) 50 mg, Oral, Daily  . Multiple Vitamin (MULTIVITAMIN WITH MINERALS) TABS tablet 1 tablet, Oral, Daily  . nitroGLYCERIN (NITRODUR - DOSED IN MG/24 HR) 0.4 mg, Transdermal, Daily, Right foot  . pravastatin (PRAVACHOL) 20 mg, Oral, Daily  . Xarelto 2.5 mg, Oral, 2 times daily   Radiology:   No results found.  Cardiac Studies:    Echocardiogram 05/02/2016: Left ventricle cavity is small. Cavitary obliteration noted and suspect intraventricular PG. Mild concentric remodeling of the left ventricle. Basal septal hypertrophy. Normal global wall motion. Doppler evidence of grade II (pseudonormal) diastolic dysfunction, elevated LAP. Calculated EF 74%. Mild (Grade I) mitral regurgitation. No evidence of mitral valve stenosis. Moderate tricuspid regurgitation. Mild pulmonary hypertension. Pulmonary artery systolic pressure is estimated at 33 mm Hg. Mild pulmonic regurgitation.  Peripheral arteriogram 10/27/2018:  Right SFA tandem 95% mid stenosis, 1%) atrial artery occlusion.  One-vessel runoff in the form of right anterior tibial.  Successful PTCA with 5.0 X 200 mm In.Pact DCB left distal superficial femoral artery/proximal left poppliteal artery and 5.0 X 60 mm In.Pact DCB left proximal superficial femoral artery      11/24/2018: Diffuse disease in the left SFA from 60-95% stenosis, diffuse disease and popliteal vessel up to 40%, occluded left DP trunk, patent left anterior tibial with distal 100% occlusion at the ankle. Right SFA: Tandem 95% stenoses right mid SFA Subtotally occluded rt popliteal artery/ Rt TP trunk, with reconstitution of all three vessels below the knee with diffuse distal disease. Atherectomy (1.5 solid [place]back) Rt mid to distal SFA, Rt popliteal artery, Rt TP trunk PTA 3.0 X 200 mm balloon Rt common peroneal artery to Rt mid SFA PTA 5.0 X 150 mm balloon Rt mid to distal SFA 10% residual stenosis  ABI 11/25/2018: Right: Resting right ankle-brachial index indicates noncompressible right lower extremity arteries. The right toe-brachial index is abnormal. Non compressible and ABIs are unreliable. Monophasic ABI 1.15. snd TBI 0.25  Left: Resting left ankle-brachial index indicates mild left lower extremity arterial disease. The left toe-brachial index is abnormal. Monophasic ABI 0.86 and TBI  0.25 Compared to 09/16/18, Right ABI 0.40 and Left ABI 0.60.  Carotid artery duplex 04/05/2019:  Duplex suggests stenosis in the right internal carotid artery (16-49%).  Stenosis in the right external carotid artery (<50%).  Stenosis in the left external carotid artery (<  50%).  Antegrade right vertebral artery flow. Antegrade left vertebral artery flow.  Follow up in one year is appropriate if clinically indicated. External carotid stenosis source of bruit.  Lower Extremity Arterial Duplex 04/05/2019:  Monophasic waveform throughout the right SFA suggests significant (>50%  right proximal SFA stenosis).  Monophasic waveform below the left knee suggests diffuse small vessel  disease below the left knee.  This exam reveals critically decreased perfusion of the right lower  extremity, noted at the anterior tibial artery level (ABI 0.40) and  moderately decreased perfusion of the left lower extremity, noted at the  post tibial artery level (ABI 0.90).  Compared to 09/16/2018, bilateral  critical decrease in ABI, right 0.40 and left 0.60 is unchanged. However post procedure ABI on 11/25/2018 was 1.15 right and 0.86 left. Consider further work up including angiography.  Peripheral arteriogram 04/20/19: Findings: Distal abdominal aorta and aortoiliac bifurcation widely patent, bilateral common femoral arteries are widely patent. Right SFA is calcified and diffusely diseased, mid and distal segment is occluded.  It reconstitutes below the right knee via collaterals, right popliteal artery and TP trunk are tandem occluded.  Successful atherectomy using [place] 2.0 Solid state Laser of the right mid and distal SFA and Proximal Popliteal artery. Balloon PTCA of the Right TP trunk,  [id number], Popliteal and mid and distal SFA BALLOON COYOTE OTW [id number].  100% to < 10%.  There was brisk flow evident all the way from the mid SFA on the right all the way up to TP trunk.  Below the right knee, there is no  direct communication and vessels are free by collaterals.  Complications: Patient had vasovagal episode with nausea and vomiting and received 10 mg of intravenous hydralazine for hypertension at the same time leading to significant hypotension and seizure.  Patient was started on IV norepinephrine, stabilized blood pressure at 5 mcg/kg/min.  ABI 05/07/2019:  This exam reveals mildly decreased perfusion of the right lower extremity, noted at the post tibial artery level (ABI 0.89) and mildly decreased perfusion of the left lower extremity, noted at the dorsalis pedis artery level (ABI 0.81).  Compared to 04/05/2019, right ABI 0.40 and left ABI 0.90.  Study suggests improvement in the right ABI post angioplasty.  Lower Extremity Arterial Duplex 07/30/2019:  This exam reveals severely decreased perfusion of the right lower  extremity, noted at the anterior tibial and post tibial artery level (ABI  0.42) and moderately decreased perfusion of the left lower extremity,  noted at the anterior tibial and post tibial artery level (ABI 0.7).  Compared to the study done on 11/25/2018, right ABI has decreased from 1 1.15. Consider complete lower extremity arterial duplex or arteriogram.  EKG  EKG 08/09/2019: Normal sinus rhythm with rate of 70 bpm, normal axis, no evidence of ischemia, normal EKG.   No significant change from 05/03/2019   Assessment     ICD-10-CM   1. Critical limb ischemia with history of revascularization of same extremity  I99.8 EKG 12-Lead   Z95.9 rivaroxaban (XARELTO) 2.5 MG TABS tablet    nitroGLYCERIN (NITRODUR - DOSED IN MG/24 HR) 0.4 mg/hr patch  2. PAD (peripheral artery disease) ([facility])  I73.9 rivaroxaban (XARELTO) 2.5 MG TABS tablet    nitroGLYCERIN (NITRODUR - DOSED IN MG/24 HR) 0.4 mg/hr patch  3. Primary hypertension  I10      Meds ordered this encounter  Medications  . rivaroxaban (XARELTO) 2.5 MG TABS tablet    Sig: Take 1 tablet (2.5 mg total) by  mouth 2 (two)  times daily.    Dispense:  60 tablet    Refill:  3  . nitroGLYCERIN (NITRODUR - DOSED IN MG/24 HR) 0.4 mg/hr patch    Sig: Place 1 patch (0.4 mg total) onto the skin daily. Right foot    Dispense:  90 patch    Refill:  3    Medications Discontinued During This Encounter  Medication Reason  . nitroGLYCERIN (NITRODUR - DOSED IN MG/24 HR) 0.4 mg/hr patch Reorder  . XARELTO 2.5 MG TABS tablet Reorder    Recommendations:   Angela Benitez  is a  86 y.o. with hypertension, diabetes mellitus type 2 and hypercholesterolemia and PAD presents for post procedure f/u.  Due to severe symptoms of claudication, underwent peripheral arteriogram on 10/27/2018 with angioplasty to right SFA and in a staged fashion angioplasty to left SFA on 11/24/2018.  Few weeks ago she developed small ulceration involving the right foot and also pain in bilateral foot right worse than left especially at night.  She underwent peripheral arteriogram on 04/20/19 with successful angioplasty with Ayuron solid state laser atherectomy followed by balloon PTC of the right distal SFA and popliteal artery and TP trunk.  Due to recent ABI revealing marked abnormality I brought her in to reassess her.  She is presently doing well, and she has noticed warmth in her right foot, ulcer has completely healed.  Except for minimal symptoms of leg cramps occasionally, states that she is doing well and is also sleeping well without leg pain.  I offered her to repeat angiography however patient states that she is doing well and her foot is warm and she would like to wait for now.  I refilled her prescriptions for nitroglycerin patch to her feet and continue Xarelto 2.5 mg p.o. twice daily.  I will see her back in 3 months for follow-up. BP is controlled and will accept relative hypertension for perfusion.     Adrian Prows, MD, [facility] 08/09/2019, 10:22 PM Office: [phone]

## 2019-08-09 NOTE — H&P (View-Only) (Signed)
Primary Physician/Referring:  Aretta Nip, MD  Patient ID: Angela Benitez, female    DOB: 1933-10-23, 86 y.o.   MRN: [id number]  Chief Complaint  Patient presents with  . Follow-up  . Circulatory Problem    peripheral artery disease   HPI:    Angela Benitez  is a 86 y.o. AA female  with hypertension, diabetes mellitus type 2 and hypercholesterolemia and PAD presents for post procedure f/u.  Due to severe symptoms of claudication, underwent peripheral arteriogram on 10/27/2018 with angioplasty to right SFA and in a staged fashion angioplasty to left SFA on 11/24/2018.  Few weeks ago she developed small ulceration involving the right foot and also pain in bilateral foot right worse than left especially at night.  She underwent peripheral arteriogram on 04/20/19 with successful angioplasty with Ayuron solid state laser atherectomy followed by balloon PTC of the right distal SFA and popliteal artery and TP trunk. She now presents for f/u.   I had seen her 2 months ago, she recently underwent ABI and due to marked abnormalities in the ABI I brought her in to reevaluate her symptoms. She is presently doing well, and she has noticed warmth in her right foot, ulcer has completely healed.  Except for minimal symptoms of leg cramps occasionally, states that she is doing well and is also sleeping well without leg pain.    Past Medical History:  Diagnosis Date  . Claudication in peripheral vascular disease ([place]) 10/27/2018  . Diabetes mellitus without complication ([place])   . Hyperlipidemia   . Hypertension   . Mild mitral regurgitation   . Murmur   . Neuropathy   . PAD (peripheral artery disease) ([place])    Past Surgical History:  Procedure Laterality Date  . ABDOMINAL AORTOGRAM N/A 10/27/2018   Procedure: ABDOMINAL AORTOGRAM;  Surgeon: Nigel Mormon, MD;  Location: [facility];  Service: Cardiovascular;  Laterality: N/A;  . LOWER EXTREMITY ANGIOGRAPHY Bilateral 10/27/2018    Procedure: LOWER EXTREMITY ANGIOGRAPHY;  Surgeon: Nigel Mormon, MD;  Location: [facility];  Service: Cardiovascular;  Laterality: Bilateral;  . LOWER EXTREMITY ANGIOGRAPHY Right 11/24/2018   Procedure: LOWER EXTREMITY ANGIOGRAPHY;  Surgeon: Nigel Mormon, MD;  Location: [facility];  Service: Cardiovascular;  Laterality: Right;  . LOWER EXTREMITY ANGIOGRAPHY  04/20/2019   Procedure: Lower Extremity Angiography;  Surgeon: Adrian Prows, MD;  Location: [facility];  Service: Cardiovascular;;  Bilateral limited study  . PERIPHERAL VASCULAR ATHERECTOMY Left 10/27/2018   Procedure: PERIPHERAL VASCULAR ATHERECTOMY;  Surgeon: Nigel Mormon, MD;  Location: [facility];  Service: Cardiovascular;  Laterality: Left;  SFA with DRUG COATED BALLOON  . PERIPHERAL VASCULAR ATHERECTOMY Right 11/24/2018   Procedure: PERIPHERAL VASCULAR ATHERECTOMY;  Surgeon: Nigel Mormon, MD;  Location: [facility];  Service: Cardiovascular;  Laterality: Right;  fem/pop  . PERIPHERAL VASCULAR BALLOON ANGIOPLASTY Right 11/24/2018   Procedure: PERIPHERAL VASCULAR BALLOON ANGIOPLASTY;  Surgeon: Nigel Mormon, MD;  Location: [facility];  Service: Cardiovascular;  Laterality: Right;  fem/pop peroneal  . PERIPHERAL VASCULAR BALLOON ANGIOPLASTY  04/20/2019   Procedure: PERIPHERAL VASCULAR BALLOON ANGIOPLASTY;  Surgeon: Adrian Prows, MD;  Location: [facility];  Service: Cardiovascular;;  With Laser treatment   Family History  Problem Relation Age of Onset  . Stroke Father   . Lung cancer Son     Social History   Tobacco Use  . Smoking status: Never Smoker  .  Smokeless tobacco: Never Used  Substance Use Topics  . Alcohol use: No   Marital Status: Widowed  ROS  Review of Systems  Cardiovascular: Positive for claudication. Negative for chest pain, dyspnea on exertion and leg swelling.  Musculoskeletal: Negative for joint swelling.  Gastrointestinal:  Negative for melena.   Objective  Blood pressure 140/66, pulse 72, resp. rate 17, height _0  (1.6 m), weight 132 lb (59.9 kg), SpO2 99 %.  Vitals with BMI 08/09/2019 06/04/2019 05/03/2019  Height _1  _2  -  Weight 132 lbs 135 lbs -  BMI 91.50 56.97 -  Systolic 948 016 553  Diastolic 66 70 80  Pulse 72 66 -     Physical Exam Constitutional:      General: She is not in acute distress.    Appearance: She is well-developed.  Cardiovascular:     Rate and Rhythm: Normal rate and regular rhythm.     Pulses: Intact distal pulses.          Carotid pulses are on the left side with bruit.      Femoral pulses are 1+ on the right side and 2+ on the left side.      Popliteal pulses are 0 on the right side and 0 on the left side.       Dorsalis pedis pulses are 0 on the right side and 0 on the left side.       Posterior tibial pulses are 0 on the right side and 0 on the left side.     Heart sounds: Murmur heard.  Systolic murmur is present with a grade of 1/6 at the upper right sternal border.  No gallop.      Comments: Bilateral feet ischemic appearing.  Right foot superficial ulceration limited to skin healed. Bilateral foot warm. Pulmonary:     Effort: Pulmonary effort is normal. No accessory muscle usage or respiratory distress.     Breath sounds: Normal breath sounds.  Abdominal:     Palpations: Abdomen is soft.    Laboratory examination:   Recent Labs    11/11/18 0936 11/24/18 0615 11/24/18 1437 04/15/19 1311 04/21/19 0104  NA 143   < > 140 145* 140  K 4.3   < > 3.8 4.5 4.3  CL 101   < > 106 108* 109  CO2 26  --   --  23 24  GLUCOSE 128*   < > 165* 58* 159*  BUN 21   < > _3 CREATININE 1.29*   < > 0.70 1.10* 1.15*  CALCIUM 10.7*  --   --  9.9 8.6*  GFRNONAA 38*  --   --  46* 44*  GFRAA 44*  --   --  53* 51*   < > = values in this interval not displayed.   CrCl cannot be calculated (Patient's most recent lab result is older than the maximum 21 days allowed.).    CMP Latest Ref Rng & Units 04/21/2019 04/15/2019 11/24/2018  Glucose 70 - 99 mg/dL 159(H) 58(L) 165(H)  BUN 8 - 23 mg/dL _4 Creatinine 0.44 - 1.00 mg/dL 1.15(H) 1.10(H) 0.70  Sodium 135 - 145 mmol/L 140 145(H) 140  Potassium 3.5 - 5.1 mmol/L 4.3 4.5 3.8  Chloride 98 - 111 mmol/L 109 108(H) 106  CO2 22 - 32 mmol/L 24 23 -  Calcium 8.9 - 10.3 mg/dL 8.6(L) 9.9 -   CBC Latest Ref Rng & Units 04/21/2019 04/20/2019 04/15/2019  WBC 4.0 - 10.5 K/uL 9.9 9.3 7.8  Hemoglobin 12.0 - 15.0 g/dL 10.4(L) 11.4(L) 13.1  Hematocrit 36 - 46 % 31.9(L) 35.7(L) 41.3  Platelets 150 - 400 K/uL 194 242 245   External labs:   12/14/2018:  Lipid Panel completed 12/14/2018 HDL 58.000 12/14/2018 LDL 84.000 12/14/2018 Cholesterol, total 161.000 12/14/2018 Triglycerides 94.000 12/14/2018  HbA1c 6.3.   10/01/2016: TSH 0.580  Medications and allergies  No Known Allergies   Current Outpatient Medications  Medication Instructions  . acetaminophen (TYLENOL) 500 mg, Oral, Every 6 hours PRN  . aspirin EC 81 mg, Oral, Daily  . fosinopril (MONOPRIL) 40 mg, Oral, Daily  . gabapentin (NEURONTIN) 100-200 mg, Oral, At bedtime PRN  . glimepiride (AMARYL) 2-3 mg, Oral, See admin instructions, Take 3 mg with breakfast and 2 mg at supper  . hydrochlorothiazide (HYDRODIURIL) 25 mg, Oral, Daily  . latanoprost (XALATAN) 0.005 % ophthalmic solution 1 drop, Both Eyes, Daily at bedtime  . lidocaine (XYLOCAINE) 5 % ointment 1 application, Topical, As needed, Right foot as needed for pain  . metFORMIN (GLUCOPHAGE) 1,000 mg, Oral, 2 times daily with meals  . metoprolol tartrate (LOPRESSOR) 50 mg, Oral, Daily  . Multiple Vitamin (MULTIVITAMIN WITH MINERALS) TABS tablet 1 tablet, Oral, Daily  . nitroGLYCERIN (NITRODUR - DOSED IN MG/24 HR) 0.4 mg, Transdermal, Daily, Right foot  . pravastatin (PRAVACHOL) 20 mg, Oral, Daily  . Xarelto 2.5 mg, Oral, 2 times daily   Radiology:   No results found.  Cardiac Studies:    Echocardiogram 05/02/2016: Left ventricle cavity is small. Cavitary obliteration noted and suspect intraventricular PG. Mild concentric remodeling of the left ventricle. Basal septal hypertrophy. Normal global wall motion. Doppler evidence of grade II (pseudonormal) diastolic dysfunction, elevated LAP. Calculated EF 74%. Mild (Grade I) mitral regurgitation. No evidence of mitral valve stenosis. Moderate tricuspid regurgitation. Mild pulmonary hypertension. Pulmonary artery systolic pressure is estimated at 33 mm Hg. Mild pulmonic regurgitation.  Peripheral arteriogram 10/27/2018:  Right SFA tandem 95% mid stenosis, 1%) atrial artery occlusion.  One-vessel runoff in the form of right anterior tibial.  Successful PTCA with 5.0 X 200 mm In.Pact DCB left distal superficial femoral artery/proximal left poppliteal artery and 5.0 X 60 mm In.Pact DCB left proximal superficial femoral artery      11/24/2018: Diffuse disease in the left SFA from 60-95% stenosis, diffuse disease and popliteal vessel up to 40%, occluded left DP trunk, patent left anterior tibial with distal 100% occlusion at the ankle. Right SFA: Tandem 95% stenoses right mid SFA Subtotally occluded rt popliteal artery/ Rt TP trunk, with reconstitution of all three vessels below the knee with diffuse distal disease. Atherectomy (1.5 solid [place] Di[place]back) Rt mid to distal SFA, Rt popliteal artery, Rt TP trunk PTA 3.0 X 200 mm balloon Rt common peroneal artery to Rt mid SFA PTA 5.0 X 150 mm balloon Rt mid to distal SFA 10% residual stenosis  ABI 11/25/2018: Right: Resting right ankle-brachial index indicates noncompressible right lower extremity arteries. The right toe-brachial index is abnormal. Non compressible and ABIs are unreliable. Monophasic ABI 1.15. snd TBI 0.25  Left: Resting left ankle-brachial index indicates mild left lower extremity arterial disease. The left toe-brachial index is abnormal. Monophasic ABI 0.86 and TBI  0.25 Compared to 09/16/18, Right ABI 0.40 and Left ABI 0.60.  Carotid artery duplex 04/05/2019:  Duplex suggests stenosis in the right internal carotid artery (16-49%).  Stenosis in the right external carotid artery (<50%).  Stenosis in the left external carotid artery (<  50%).  Antegrade right vertebral artery flow. Antegrade left vertebral artery flow.  Follow up in one year is appropriate if clinically indicated. External carotid stenosis source of bruit.  Lower Extremity Arterial Duplex 04/05/2019:  Monophasic waveform throughout the right SFA suggests significant (>50%  right proximal SFA stenosis).  Monophasic waveform below the left knee suggests diffuse small vessel  disease below the left knee.  This exam reveals critically decreased perfusion of the right lower  extremity, noted at the anterior tibial artery level (ABI 0.40) and  moderately decreased perfusion of the left lower extremity, noted at the  post tibial artery level (ABI 0.90).  Compared to 09/16/2018, bilateral  critical decrease in ABI, right 0.40 and left 0.60 is unchanged. However post procedure ABI on 11/25/2018 was 1.15 right and 0.86 left. Consider further work up including angiography.  Peripheral arteriogram 04/20/19: Findings: Distal abdominal aorta and aortoiliac bifurcation widely patent, bilateral common femoral arteries are widely patent. Right SFA is calcified and diffusely diseased, mid and distal segment is occluded.  It reconstitutes below the right knee via collaterals, right popliteal artery and TP trunk are tandem occluded.  Successful atherectomy using [place]on 2.0 Solid state Laser of the right mid and distal SFA and Proximal Popliteal artery. Balloon PTCA of the Right TP trunk,  [id number], Popliteal and mid and distal SFA BALLOON COYOTE OTW [id number].  100% to < 10%.  There was brisk flow evident all the way from the mid SFA on the right all the way up to TP trunk.  Below the right knee, there is no  direct communication and vessels are free by collaterals.  Complications: Patient had vasovagal episode with nausea and vomiting and received 10 mg of intravenous hydralazine for hypertension at the same time leading to significant hypotension and seizure.  Patient was started on IV norepinephrine, stabilized blood pressure at 5 mcg/kg/min.  ABI 05/07/2019:  This exam reveals mildly decreased perfusion of the right lower extremity, noted at the post tibial artery level (ABI 0.89) and mildly decreased perfusion of the left lower extremity, noted at the dorsalis pedis artery level (ABI 0.81).  Compared to 04/05/2019, right ABI 0.40 and left ABI 0.90.  Study suggests improvement in the right ABI post angioplasty.  Lower Extremity Arterial Duplex 07/30/2019:  This exam reveals severely decreased perfusion of the right lower  extremity, noted at the anterior tibial and post tibial artery level (ABI  0.42) and moderately decreased perfusion of the left lower extremity,  noted at the anterior tibial and post tibial artery level (ABI 0.7).  Compared to the study done on 11/25/2018, right ABI has decreased from 1 1.15. Consider complete lower extremity arterial duplex or arteriogram.  EKG  EKG 08/09/2019: Normal sinus rhythm with rate of 70 bpm, normal axis, no evidence of ischemia, normal EKG.   No significant change from 05/03/2019   Assessment     ICD-10-CM   1. Critical limb ischemia with history of revascularization of same extremity  I99.8 EKG 12-Lead   Z95.9 rivaroxaban (XARELTO) 2.5 MG TABS tablet    nitroGLYCERIN (NITRODUR - DOSED IN MG/24 HR) 0.4 mg/hr patch  2. PAD (peripheral artery disease) ([facility])  I73.9 rivaroxaban (XARELTO) 2.5 MG TABS tablet    nitroGLYCERIN (NITRODUR - DOSED IN MG/24 HR) 0.4 mg/hr patch  3. Primary hypertension  I10      Meds ordered this encounter  Medications  . rivaroxaban (XARELTO) 2.5 MG TABS tablet    Sig: Take 1 tablet (2.5 mg total) by  mouth 2 (two)  times daily.    Dispense:  60 tablet    Refill:  3  . nitroGLYCERIN (NITRODUR - DOSED IN MG/24 HR) 0.4 mg/hr patch    Sig: Place 1 patch (0.4 mg total) onto the skin daily. Right foot    Dispense:  90 patch    Refill:  3    Medications Discontinued During This Encounter  Medication Reason  . nitroGLYCERIN (NITRODUR - DOSED IN MG/24 HR) 0.4 mg/hr patch Reorder  . XARELTO 2.5 MG TABS tablet Reorder    Recommendations:   Angela Benitez  is a  86 y.o. with hypertension, diabetes mellitus type 2 and hypercholesterolemia and PAD presents for post procedure f/u.  Due to severe symptoms of claudication, underwent peripheral arteriogram on 10/27/2018 with angioplasty to right SFA and in a staged fashion angioplasty to left SFA on 11/24/2018.  Few weeks ago she developed small ulceration involving the right foot and also pain in bilateral foot right worse than left especially at night.  She underwent peripheral arteriogram on 04/20/19 with successful angioplasty with Ayuron solid state laser atherectomy followed by balloon PTC of the right distal SFA and popliteal artery and TP trunk.  Due to recent ABI revealing marked abnormality I brought her in to reassess her.  She is presently doing well, and she has noticed warmth in her right foot, ulcer has completely healed.  Except for minimal symptoms of leg cramps occasionally, states that she is doing well and is also sleeping well without leg pain.  I offered her to repeat angiography however patient states that she is doing well and her foot is warm and she would like to wait for now.  I refilled her prescriptions for nitroglycerin patch to her feet and continue Xarelto 2.5 mg p.o. twice daily.  I will see her back in 3 months for follow-up. BP is controlled and will accept relative hypertension for perfusion.     Adrian Prows, MD, [facility] 08/09/2019, 10:22 PM Office: [phone]

## 2019-08-09 NOTE — Telephone Encounter (Signed)
Patient has appt, currently being seen.

## 2019-08-16 NOTE — Telephone Encounter (Signed)
From patient.

## 2019-08-27 ENCOUNTER — Telehealth: Payer: Self-pay

## 2019-09-03 ENCOUNTER — Other Ambulatory Visit (HOSPITAL_COMMUNITY): Payer: Self-pay

## 2019-09-03 ENCOUNTER — Other Ambulatory Visit: Payer: Self-pay

## 2019-09-03 DIAGNOSIS — I10 Essential (primary) hypertension: Secondary | ICD-10-CM

## 2019-09-03 DIAGNOSIS — I739 Peripheral vascular disease, unspecified: Secondary | ICD-10-CM | POA: Diagnosis not present

## 2019-09-03 NOTE — Telephone Encounter (Signed)
Error

## 2019-09-04 LAB — BMP8+EGFR
BUN/Creatinine Ratio: 26 (ref 12–28)
BUN: 30 mg/dL — ABNORMAL HIGH (ref 8–27)
CO2: 25 mmol/L (ref 20–29)
Calcium: 10.2 mg/dL (ref 8.7–10.3)
Chloride: 104 mmol/L (ref 96–106)
Creatinine, Ser: 1.16 mg/dL — ABNORMAL HIGH (ref 0.57–1.00)
GFR calc Af Amer: 50 mL/min/{1.73_m2} — ABNORMAL LOW (ref >59)
GFR calc non Af Amer: 43 mL/min/{1.73_m2} — ABNORMAL LOW (ref >59)
Glucose: 136 mg/dL — ABNORMAL HIGH (ref 65–99)
Potassium: 4.5 mmol/L (ref 3.5–5.2)
Sodium: 142 mmol/L (ref 134–144)

## 2019-09-04 LAB — CBC WITH DIFFERENTIAL
Basophils Absolute: 0 10*3/uL (ref 0.0–0.2)
Basos: 1 %
EOS (ABSOLUTE): 0.4 10*3/uL (ref 0.0–0.4)
Eos: 5 %
Hematocrit: 39.6 % (ref 34.0–46.6)
Hemoglobin: 12.8 g/dL (ref 11.1–15.9)
Immature Grans (Abs): 0 10*3/uL (ref 0.0–0.1)
Immature Granulocytes: 0 %
Lymphocytes Absolute: 2 10*3/uL (ref 0.7–3.1)
Lymphs: 30 %
MCH: 28.7 pg (ref 26.6–33.0)
MCHC: 32.3 g/dL (ref 31.5–35.7)
MCV: 89 fL (ref 79–97)
Monocytes Absolute: 0.4 10*3/uL (ref 0.1–0.9)
Monocytes: 6 %
Neutrophils Absolute: 3.8 10*3/uL (ref 1.4–7.0)
Neutrophils: 58 %
RBC: 4.46 x10E6/uL (ref 3.77–5.28)
RDW: 12.7 % (ref 11.7–15.4)
WBC: 6.6 10*3/uL (ref 3.4–10.8)

## 2019-09-07 ENCOUNTER — Ambulatory Visit (HOSPITAL_COMMUNITY)
Admission: RE | Disposition: A | Discharge status: Home / Self Care | Payer: Self-pay | Source: Home / Self Care | Attending: Cardiology

## 2019-09-07 ENCOUNTER — Other Ambulatory Visit: Payer: Self-pay

## 2019-09-07 ENCOUNTER — Ambulatory Visit (HOSPITAL_COMMUNITY)
Admission: RE | Admit: 2019-09-07 | Discharge: 2019-09-07 | Disposition: A | Discharge status: Home / Self Care | Payer: Medicare Other | Attending: Cardiology | Admitting: Cardiology

## 2019-09-07 DIAGNOSIS — E1140 Type 2 diabetes mellitus with diabetic neuropathy, unspecified: Secondary | ICD-10-CM | POA: Insufficient documentation

## 2019-09-07 DIAGNOSIS — Z7982 Long term (current) use of aspirin: Secondary | ICD-10-CM | POA: Insufficient documentation

## 2019-09-07 DIAGNOSIS — Z7901 Long term (current) use of anticoagulants: Secondary | ICD-10-CM | POA: Diagnosis not present

## 2019-09-07 DIAGNOSIS — E1151 Type 2 diabetes mellitus with diabetic peripheral angiopathy without gangrene: Secondary | ICD-10-CM | POA: Diagnosis not present

## 2019-09-07 DIAGNOSIS — I70211 Atherosclerosis of native arteries of extremities with intermittent claudication, right leg: Secondary | ICD-10-CM | POA: Diagnosis not present

## 2019-09-07 DIAGNOSIS — I998 Other disorder of circulatory system: Secondary | ICD-10-CM | POA: Diagnosis not present

## 2019-09-07 DIAGNOSIS — Z7984 Long term (current) use of oral hypoglycemic drugs: Secondary | ICD-10-CM | POA: Insufficient documentation

## 2019-09-07 DIAGNOSIS — Z20822 Contact with and (suspected) exposure to covid-19: Secondary | ICD-10-CM | POA: Diagnosis not present

## 2019-09-07 DIAGNOSIS — I10 Essential (primary) hypertension: Secondary | ICD-10-CM | POA: Diagnosis not present

## 2019-09-07 DIAGNOSIS — I739 Peripheral vascular disease, unspecified: Secondary | ICD-10-CM | POA: Diagnosis not present

## 2019-09-07 DIAGNOSIS — E785 Hyperlipidemia, unspecified: Secondary | ICD-10-CM | POA: Insufficient documentation

## 2019-09-07 DIAGNOSIS — Z959 Presence of cardiac and vascular implant and graft, unspecified: Secondary | ICD-10-CM | POA: Diagnosis not present

## 2019-09-07 DIAGNOSIS — E7800 Pure hypercholesterolemia, unspecified: Secondary | ICD-10-CM | POA: Diagnosis not present

## 2019-09-07 DIAGNOSIS — Z79899 Other long term (current) drug therapy: Secondary | ICD-10-CM | POA: Insufficient documentation

## 2019-09-07 DIAGNOSIS — Z9889 Other specified postprocedural states: Secondary | ICD-10-CM | POA: Diagnosis present

## 2019-09-07 HISTORY — PX: ABDOMINAL AORTOGRAM W/LOWER EXTREMITY: CATH118223

## 2019-09-07 LAB — GLUCOSE, CAPILLARY
Glucose-Capillary: 198 mg/dL — ABNORMAL HIGH (ref 70–99)
Glucose-Capillary: 150 mg/dL — ABNORMAL HIGH (ref 70–99)

## 2019-09-07 LAB — SARS CORONAVIRUS 2 BY RT PCR (HOSPITAL ORDER, PERFORMED IN ~~LOC~~ HOSPITAL LAB): SARS Coronavirus 2: NEGATIVE

## 2019-09-07 LAB — PLATELET COUNT: Platelets: 225 10*3/uL (ref 150–400)

## 2019-09-07 SURGERY — ABDOMINAL AORTOGRAM W/LOWER EXTREMITY
Anesthesia: LOCAL

## 2019-09-07 MED ORDER — FENTANYL CITRATE (PF) 100 MCG/2ML IJ SOLN
INTRAMUSCULAR | Status: DC | PRN
  Administered 2019-09-07: 50 ug via INTRAVENOUS
  Administered 2019-09-07: 25 ug via INTRAVENOUS

## 2019-09-07 MED ORDER — SODIUM CHLORIDE 0.9 % IV SOLN: 250.0000 mL | INTRAVENOUS | Status: DC | PRN

## 2019-09-07 MED ORDER — SODIUM CHLORIDE 0.9% FLUSH: 3.0000 mL | INTRAVENOUS | Status: DC | PRN

## 2019-09-07 MED ORDER — CLOPIDOGREL BISULFATE 300 MG PO TABS
ORAL_TABLET | ORAL | Status: AC
  Filled 2019-09-07: qty 1

## 2019-09-07 MED ORDER — SODIUM CHLORIDE 0.9% FLUSH: 3.0000 mL | Freq: Two times a day (BID) | INTRAVENOUS | Status: DC

## 2019-09-07 MED ORDER — METFORMIN HCL 1000 MG PO TABS: 1000.0000 mg | ORAL_TABLET | Freq: Two times a day (BID) | ORAL | Status: DC

## 2019-09-07 MED ORDER — HEPARIN SODIUM (PORCINE) 1000 UNIT/ML IJ SOLN
INTRAMUSCULAR | Status: AC
  Filled 2019-09-07: qty 1

## 2019-09-07 MED ORDER — MIDAZOLAM HCL 2 MG/2ML IJ SOLN
INTRAMUSCULAR | Status: DC | PRN
  Administered 2019-09-07 (×2): 1 mg via INTRAVENOUS

## 2019-09-07 MED ORDER — HYDRALAZINE HCL 20 MG/ML IJ SOLN
INTRAMUSCULAR | Status: AC
  Filled 2019-09-07: qty 1

## 2019-09-07 MED ORDER — MIDAZOLAM HCL 2 MG/2ML IJ SOLN
INTRAMUSCULAR | Status: AC
  Filled 2019-09-07: qty 2

## 2019-09-07 MED ORDER — HEPARIN SODIUM (PORCINE) 1000 UNIT/ML IJ SOLN
INTRAMUSCULAR | Status: DC | PRN
  Administered 2019-09-07: 2000 [IU] via INTRAVENOUS
  Administered 2019-09-07: 3000 [IU] via INTRAVENOUS
  Administered 2019-09-07: 6000 [IU] via INTRAVENOUS
  Administered 2019-09-07: 3000 [IU] via INTRAVENOUS

## 2019-09-07 MED ORDER — SODIUM CHLORIDE 0.9 % WEIGHT BASED INFUSION: 1.0000 mL/kg/h | INTRAVENOUS | Status: DC

## 2019-09-07 MED ORDER — NITROGLYCERIN IN D5W 200-5 MCG/ML-% IV SOLN
INTRAVENOUS | Status: AC
  Filled 2019-09-07: qty 250

## 2019-09-07 MED ORDER — HEPARIN (PORCINE) IN NACL 1000-0.9 UT/500ML-% IV SOLN
INTRAVENOUS | Status: DC | PRN
  Administered 2019-09-07 (×2): 500 mL

## 2019-09-07 MED ORDER — ONDANSETRON HCL 4 MG/2ML IJ SOLN: 4.0000 mg | Freq: Four times a day (QID) | INTRAMUSCULAR | Status: DC | PRN

## 2019-09-07 MED ORDER — FENTANYL CITRATE (PF) 100 MCG/2ML IJ SOLN
INTRAMUSCULAR | Status: AC
  Filled 2019-09-07: qty 2

## 2019-09-07 MED ORDER — LIDOCAINE HCL (PF) 1 % IJ SOLN
INTRAMUSCULAR | Status: DC | PRN
  Administered 2019-09-07: 18 mL via INTRADERMAL

## 2019-09-07 MED ORDER — CLOPIDOGREL BISULFATE 300 MG PO TABS
ORAL_TABLET | ORAL | Status: DC | PRN
  Administered 2019-09-07: 300 mg via ORAL

## 2019-09-07 MED ORDER — HYDRALAZINE HCL 20 MG/ML IJ SOLN
INTRAMUSCULAR | Status: DC | PRN
  Administered 2019-09-07: 5 mg via INTRAVENOUS

## 2019-09-07 MED ORDER — ACETAMINOPHEN 325 MG PO TABS: 650.0000 mg | ORAL_TABLET | ORAL | Status: DC | PRN

## 2019-09-07 MED ORDER — ASPIRIN 81 MG PO CHEW: 81.0000 mg | CHEWABLE_TABLET | ORAL | Status: DC

## 2019-09-07 MED ORDER — SODIUM CHLORIDE 0.9 % WEIGHT BASED INFUSION
3.0000 mL/kg/h | INTRAVENOUS | Status: AC
  Administered 2019-09-07: 3 mL/kg/h via INTRAVENOUS

## 2019-09-07 MED ORDER — IODIXANOL 320 MG/ML IV SOLN
INTRAVENOUS | Status: DC | PRN
  Administered 2019-09-07: 90 mL via INTRA_ARTERIAL

## 2019-09-07 MED ORDER — VERAPAMIL HCL 2.5 MG/ML IV SOLN
INTRAVENOUS | Status: AC
  Filled 2019-09-07: qty 2

## 2019-09-07 MED ORDER — HEPARIN (PORCINE) IN NACL 1000-0.9 UT/500ML-% IV SOLN
INTRAVENOUS | Status: AC
  Filled 2019-09-07: qty 1000

## 2019-09-07 MED ORDER — LIDOCAINE HCL (PF) 1 % IJ SOLN
INTRAMUSCULAR | Status: AC
  Filled 2019-09-07: qty 30

## 2019-09-07 MED ORDER — CEFAZOLIN SODIUM-DEXTROSE 2-4 GM/100ML-% IV SOLN
2.0000 g | INTRAVENOUS | Status: AC
  Administered 2019-09-07: 2 g via INTRAVENOUS
  Filled 2019-09-07: qty 100

## 2019-09-07 MED ORDER — HYDRALAZINE HCL 20 MG/ML IJ SOLN: 5.0000 mg | INTRAMUSCULAR | Status: DC | PRN

## 2019-09-07 MED ORDER — CLOPIDOGREL BISULFATE 75 MG PO TABS
75.0000 mg | ORAL_TABLET | Freq: Every day | ORAL | 0 refills | Status: DC
Start: 2019-09-07 — End: 2019-12-22

## 2019-09-07 SURGICAL SUPPLY — 32 items
BALLN STERLING OTW 2X220X150 (BALLOONS) ×2
BALLN STERLING OTW 4X220X150 (BALLOONS) ×2
BALLOON STERLING OTW 2X220X150 (BALLOONS) IMPLANT
BALLOON STERLING OTW 4X220X150 (BALLOONS) IMPLANT
CATH CROSS OVER TEMPO 5F (CATHETERS) ×1 IMPLANT
CATH CXI 2.3F 150 ANG (CATHETERS) ×1 IMPLANT
CATH CXI SUPP 2.6F 150 ST (CATHETERS) ×1 IMPLANT
CATH STRAIGHT 5FR 65CM (CATHETERS) ×1 IMPLANT
DCB RANGER 5.0X200 150 (BALLOONS) IMPLANT
DEVICE CLOSURE PERCLS PRGLD 6F (VASCULAR PRODUCTS) IMPLANT
GUIDEWIRE ANGLED .035X150CM (WIRE) ×1 IMPLANT
GUIDEWIRE ZILIENT 12G 014X300 (WIRE) ×1 IMPLANT
KIT ENCORE 26 ADVANTAGE (KITS) ×1 IMPLANT
KIT MICROPUNCTURE NIT STIFF (SHEATH) ×1 IMPLANT
KIT PV (KITS) ×2 IMPLANT
LUBRICANT VIPERSLIDE CORONARY (MISCELLANEOUS) IMPLANT
PERCLOSE PROGLIDE 6F (VASCULAR PRODUCTS) ×2
RANGER DCB 5.0X200 150 (BALLOONS) ×2
SHEATH HIGHFLEX ANSEL 7FR 55CM (SHEATH) ×1 IMPLANT
SHEATH PINNACLE 5F 10CM (SHEATH) ×1 IMPLANT
SHEATH PROBE COVER 6X72 (BAG) ×1 IMPLANT
STOPCOCK MORSE 400PSI 3WAY (MISCELLANEOUS) ×1 IMPLANT
SYR MEDRAD MARK 7 150ML (SYRINGE) ×2 IMPLANT
TAPE VIPERTRACK RADIOPAQ (MISCELLANEOUS) IMPLANT
TAPE VIPERTRACK RADIOPAQUE (MISCELLANEOUS) ×2
TRANSDUCER W/STOPCOCK (MISCELLANEOUS) ×2 IMPLANT
TRAY PV CATH (CUSTOM PROCEDURE TRAY) ×2 IMPLANT
TUBE CONN 8.8X1320 FR HP M-F (CONNECTOR) ×1 IMPLANT
TUBING CIL FLEX 10 FLL-RA (TUBING) ×1 IMPLANT
WIRE BENTSON .035X145CM (WIRE) ×1 IMPLANT
WIRE HI TORQ COMMND ES.014X300 (WIRE) ×1 IMPLANT
WIRE VIPER ADVANCE .017X335CM (WIRE) ×1 IMPLANT

## 2019-09-07 NOTE — Progress Notes (Signed)
Pt's daughter called about an hour after discharging. Stated pt was bleeding from groin site. Informed daughter to hold pressure and call 911. Also told her to call Dr Verl Dicker office and update him.

## 2019-09-07 NOTE — Interval H&P Note (Signed)
History and Physical Interval Note:  09/07/2019 7:44 AM  Angela Benitez  has presented today for surgery, with the diagnosis of PAD.  The various methods of treatment have been discussed with the patient and family. After consideration of risks, benefits and other options for treatment, the patient has consented to  Procedure(s): ABDOMINAL AORTOGRAM W/LOWER EXTREMITY (N/A) and possible angioplasty as a surgical intervention.  The patient's history has been reviewed, patient examined, no change in status, stable for surgery.  I have reviewed the patient's chart and labs.  Questions were answered to the patient's satisfaction.     Yates Decamp

## 2019-09-07 NOTE — Progress Notes (Signed)
Pt ambulated to restroom. Site level 0. Assisted pt with getting dressed.

## 2019-09-07 NOTE — Progress Notes (Signed)
At 1200 check, pt's site was bleeding. Dressing removed, pressure held for 25 minutes. Site level 0, VSS. New dressing in place.

## 2019-09-07 NOTE — Discharge Instructions (Signed)
Hold Metformin 48 hours. May resume evening of 09/09/19  Femoral Site Care This sheet gives you information about how to care for yourself after your procedure. Your health care provider may also give you more specific instructions. If you have problems or questions, contact your health care provider. What can I expect after the procedure?  After the procedure, it is common to have:  Bruising that usually fades within 1-2 weeks.  Tenderness at the site. Follow these instructions at home: Wound care 1. Follow instructions from your health care provider about how to take care of your insertion site. Make sure you: ? Wash your hands with soap and water before you change your bandage (dressing). If soap and water are not available, use hand sanitizer. ? Remove your dressing as told by your health care provider. In 24 hours 2. Do not take baths, swim, or use a hot tub until your health care provider approves. 3. You may shower 24-48 hours after the procedure or as told by your health care provider. ? Gently wash the site with plain soap and water. ? Pat the area dry with a clean towel. ? Do not rub the site. This may cause bleeding. 4. Do not apply powder or lotion to the site. Keep the site clean and dry. 5. Check your femoral site every day for signs of infection. Check for: ? Redness, swelling, or pain. ? Fluid or blood. ? Warmth. ? Pus or a bad smell. Activity 1. For the first 2-3 days after your procedure, or as long as directed: ? Avoid climbing stairs as much as possible. ? Do not squat. 2. Do not lift anything that is heavier than 10 lb (4.5 kg), or the limit that you are told, until your health care provider says that it is safe. For 5 days 3. Rest as directed. ? Avoid sitting for a long time without moving. Get up to take short walks every 1-2 hours. 4. Do not drive for 24 hours if you were given a medicine to help you relax (sedative). General instructions  Take  over-the-counter and prescription medicines only as told by your health care provider.  Keep all follow-up visits as told by your health care provider. This is important. Contact a health care provider if you have:  A fever or chills.  You have redness, swelling, or pain around your insertion site. Get help right away if:  The catheter insertion area swells very fast.  You pass out.  You suddenly start to sweat or your skin gets clammy.  The catheter insertion area is bleeding, and the bleeding does not stop when you hold steady pressure on the area.  The area near or just beyond the catheter insertion site becomes pale, cool, tingly, or numb. These symptoms may represent a serious problem that is an emergency. Do not wait to see if the symptoms will go away. Get medical help right away. Call your local emergency services (911 in the [place] Do not drive yourself to the hospital. Summary  After the procedure, it is common to have bruising that usually fades within 1-2 weeks.  Check your femoral site every day for signs of infection.  Do not lift anything that is heavier than 10 lb (4.5 kg), or the limit that you are told, until your health care provider says that it is safe. This information is not intended to replace advice given to you by your health care provider. Make sure you discuss any questions you have with  your health care provider. Document Revised: 01/27/2017 Document Reviewed: 01/27/2017 Elsevier Patient Education  2020 [employer].

## 2019-09-07 NOTE — Progress Notes (Signed)
Went over discharge instructions with pt and pt's daughter Sue Lush. Verbalized understanding. All questions answered.

## 2019-09-08 ENCOUNTER — Encounter (HOSPITAL_COMMUNITY): Payer: Self-pay | Admitting: Cardiology

## 2019-09-08 LAB — POCT ACTIVATED CLOTTING TIME
Activated Clotting Time: 197 seconds
Activated Clotting Time: 197 seconds
Activated Clotting Time: 241 seconds

## 2019-09-08 MED FILL — Verapamil HCl IV Soln 2.5 MG/ML: INTRAVENOUS | Qty: 2 | Status: AC

## 2019-09-08 MED FILL — Nitroglycerin IV Soln 200 MCG/ML in D5W: INTRAVENOUS | Qty: 250 | Status: AC

## 2019-09-15 ENCOUNTER — Ambulatory Visit: Payer: Medicare Other | Admitting: Cardiology

## 2019-09-20 ENCOUNTER — Other Ambulatory Visit: Payer: Self-pay

## 2019-09-20 ENCOUNTER — Encounter: Payer: Self-pay | Admitting: Cardiology

## 2019-09-20 ENCOUNTER — Ambulatory Visit: Payer: Medicare Other | Admitting: Cardiology

## 2019-09-20 VITALS — BP 167/63 | HR 65 | Resp 15 | Ht 63.0 in | Wt 138.0 lb

## 2019-09-20 DIAGNOSIS — I739 Peripheral vascular disease, unspecified: Secondary | ICD-10-CM | POA: Diagnosis not present

## 2019-09-20 DIAGNOSIS — Z959 Presence of cardiac and vascular implant and graft, unspecified: Secondary | ICD-10-CM | POA: Diagnosis not present

## 2019-09-20 DIAGNOSIS — I70229 Atherosclerosis of native arteries of extremities with rest pain, unspecified extremity: Secondary | ICD-10-CM

## 2019-09-20 DIAGNOSIS — I998 Other disorder of circulatory system: Secondary | ICD-10-CM | POA: Diagnosis not present

## 2019-09-20 DIAGNOSIS — I119 Hypertensive heart disease without heart failure: Secondary | ICD-10-CM

## 2019-09-20 DIAGNOSIS — I6523 Occlusion and stenosis of bilateral carotid arteries: Secondary | ICD-10-CM | POA: Diagnosis not present

## 2019-09-20 MED ORDER — ISOSORBIDE MONONITRATE ER 60 MG PO TB24: 60.0000 mg | ORAL_TABLET | Freq: Every evening | ORAL | 3 refills | Status: DC

## 2019-09-20 NOTE — Progress Notes (Signed)
Primary Physician/Referring:  Aretta Nip, MD  Patient ID: Angela Benitez, female    DOB: 05-Aug-1933, 86 y.o.   MRN: [id number]  Chief Complaint  Patient presents with  . Follow-up    7-10 day  . PV Case Follow up   HPI:    Angela Benitez  is a 86 y.o. AA female  with hypertension, diabetes mellitus type 2 and hypercholesterolemia and PAD presents for post procedure f/u.  Due to severe symptoms of claudication, underwent peripheral arteriogram on 10/27/2018 with angioplasty to right SFA and in a staged fashion angioplasty to left SFA on 11/24/2018.  Few weeks ago she developed small ulceration involving the right foot and also pain in bilateral foot right worse than left especially at night.  She underwent peripheral arteriogram on 04/20/19 with successful angioplasty with Ayuron solid state laser atherectomy followed by balloon PTC of the right distal SFA and popliteal artery and TP trunk.    Due to recent ABI revealing marked abnormality and persistent resting right leg pain, she underwent repeat peripheral arteriogram and successful revascularization of the right SFA and popliteal and TP trunk followed by drug-coated balloon angioplasty on 09/07/2019 and now presents for follow-up.  Feels well, no further leg pain, no ulceration.  She has resumed all her activities.  Past Medical History:  Diagnosis Date  . Claudication in peripheral vascular disease ([place]) 10/27/2018  . Diabetes mellitus without complication ([place])   . Hyperlipidemia   . Hypertension   . Mild mitral regurgitation   . Murmur   . Neuropathy   . PAD (peripheral artery disease) ([place])    Past Surgical History:  Procedure Laterality Date  . ABDOMINAL AORTOGRAM N/A 10/27/2018   Procedure: ABDOMINAL AORTOGRAM;  Surgeon: Nigel Mormon, MD;  Location: [facility];  Service: Cardiovascular;  Laterality: N/A;  . ABDOMINAL AORTOGRAM W/LOWER EXTREMITY N/A 09/07/2019   Procedure: ABDOMINAL AORTOGRAM W/LOWER  EXTREMITY;  Surgeon: Adrian Prows, MD;  Location: [facility];  Service: Cardiovascular;  Laterality: N/A;  . LOWER EXTREMITY ANGIOGRAPHY Bilateral 10/27/2018   Procedure: LOWER EXTREMITY ANGIOGRAPHY;  Surgeon: Nigel Mormon, MD;  Location: [facility];  Service: Cardiovascular;  Laterality: Bilateral;  . LOWER EXTREMITY ANGIOGRAPHY Right 11/24/2018   Procedure: LOWER EXTREMITY ANGIOGRAPHY;  Surgeon: Nigel Mormon, MD;  Location: [facility];  Service: Cardiovascular;  Laterality: Right;  . LOWER EXTREMITY ANGIOGRAPHY  04/20/2019   Procedure: Lower Extremity Angiography;  Surgeon: Adrian Prows, MD;  Location: [facility];  Service: Cardiovascular;;  Bilateral limited study  . PERIPHERAL VASCULAR ATHERECTOMY Left 10/27/2018   Procedure: PERIPHERAL VASCULAR ATHERECTOMY;  Surgeon: Nigel Mormon, MD;  Location: [facility];  Service: Cardiovascular;  Laterality: Left;  SFA with DRUG COATED BALLOON  . PERIPHERAL VASCULAR ATHERECTOMY Right 11/24/2018   Procedure: PERIPHERAL VASCULAR ATHERECTOMY;  Surgeon: Nigel Mormon, MD;  Location: [facility];  Service: Cardiovascular;  Laterality: Right;  fem/pop  . PERIPHERAL VASCULAR BALLOON ANGIOPLASTY Right 11/24/2018   Procedure: PERIPHERAL VASCULAR BALLOON ANGIOPLASTY;  Surgeon: Nigel Mormon, MD;  Location: [facility];  Service: Cardiovascular;  Laterality: Right;  fem/pop peroneal  . PERIPHERAL VASCULAR BALLOON ANGIOPLASTY  04/20/2019   Procedure: PERIPHERAL VASCULAR BALLOON ANGIOPLASTY;  Surgeon: Adrian Prows, MD;  Location: [facility];  Service: Cardiovascular;;  With Laser treatment   Family History  Problem Relation Age of Onset  . Stroke Father   . Lung cancer  Son     Social History   Tobacco Use  . Smoking status: Never Smoker  . Smokeless tobacco: Never Used  Substance Use Topics  . Alcohol use: No   Marital Status: Widowed  ROS  Review of Systems    Cardiovascular: Positive for claudication. Negative for chest pain, dyspnea on exertion and leg swelling.  Musculoskeletal: Negative for joint swelling.  Gastrointestinal: Negative for melena.   Objective  Blood pressure (!) 167/63, pulse 65, resp. rate 15, height 5' 3"  (1.6 m), weight 138 lb (62.6 kg), SpO2 99 %.  Vitals with BMI 09/20/2019 09/07/2019 09/07/2019  Height 5' 3"  - -  Weight 138 lbs - -  BMI 26.20 - -  Systolic 355 974 163  Diastolic 63 49 47  Pulse 65 63 63     Physical Exam Constitutional:      General: She is not in acute distress.    Appearance: She is well-developed.  Cardiovascular:     Rate and Rhythm: Normal rate and regular rhythm.     Pulses: Intact distal pulses.          Carotid pulses are on the left side with bruit.      Femoral pulses are 2+ on the right side and 2+ on the left side.      Popliteal pulses are 1+ on the right side and 0 on the left side.       Dorsalis pedis pulses are 0 on the right side and 0 on the left side.       Posterior tibial pulses are 0 on the right side and 0 on the left side.     Heart sounds: Murmur heard.  Systolic murmur is present with a grade of 1/6 at the upper right sternal border.  No gallop.      Comments: Bilateral feet ischemic appearing. Bilateral foot warm. Normal capillary refill Pulmonary:     Effort: Pulmonary effort is normal. No accessory muscle usage or respiratory distress.     Breath sounds: Normal breath sounds.  Abdominal:     Palpations: Abdomen is soft.    Laboratory examination:   Recent Labs    04/15/19 1311 04/21/19 0104 09/03/19 1049  NA 145* 140 142  K 4.5 4.3 4.5  CL 108* 109 104  CO2 23 24 25   GLUCOSE 58* 159* 136*  BUN 18 19 30*  CREATININE 1.10* 1.15* 1.16*  CALCIUM 9.9 8.6* 10.2  GFRNONAA 46* 44* 43*  GFRAA 53* 51* 50*   estimated creatinine clearance is 29.9 mL/min (A) (by C-G formula based on SCr of 1.16 mg/dL (H)).  CMP Latest Ref Rng & Units 09/03/2019 04/21/2019  04/15/2019  Glucose 65 - 99 mg/dL 136(H) 159(H) 58(L)  BUN 8 - 27 mg/dL 30(H) 19 18  Creatinine 0.57 - 1.00 mg/dL 1.16(H) 1.15(H) 1.10(H)  Sodium 134 - 144 mmol/L 142 140 145(H)  Potassium 3.5 - 5.2 mmol/L 4.5 4.3 4.5  Chloride 96 - 106 mmol/L 104 109 108(H)  CO2 20 - 29 mmol/L 25 24 23   Calcium 8.7 - 10.3 mg/dL 10.2 8.6(L) 9.9   CBC Latest Ref Rng & Units 09/07/2019 09/03/2019 04/21/2019  WBC 3.4 - 10.8 x10E3/uL - 6.6 9.9  Hemoglobin 11.1 - 15.9 g/dL - 12.8 10.4(L)  Hematocrit 34.0 - 46.6 % - 39.6 31.9(L)  Platelets 150 - 400 K/uL 225 - 194   External labs:   12/14/2018:  Lipid Panel completed 12/14/2018 HDL 58.000 12/14/2018 LDL 84.000 12/14/2018 Cholesterol, total 161.000 12/14/2018  Triglycerides 94.000 12/14/2018  HbA1c 6.3.   10/01/2016: TSH 0.580  Medications and allergies  No Known Allergies   Current Outpatient Medications  Medication Instructions  . acetaminophen (TYLENOL) 500 mg, Oral, Every 6 hours PRN  . aspirin EC 81 mg, Oral, Daily  . clopidogrel (PLAVIX) 75 mg, Oral, Daily  . fosinopril (MONOPRIL) 40 mg, Oral, Daily  . gabapentin (NEURONTIN) 100-200 mg, Oral, Daily at bedtime  . glimepiride (AMARYL) 2 mg, Oral, 2 times daily  . hydrochlorothiazide (HYDRODIURIL) 25 mg, Oral, Daily  . isosorbide mononitrate (IMDUR) 60 mg, Oral, Every evening  . latanoprost (XALATAN) 0.005 % ophthalmic solution 1 drop, Both Eyes, Daily at bedtime  . metFORMIN (GLUCOPHAGE) 1,000 mg, Oral, 2 times daily with meals  . metoprolol tartrate (LOPRESSOR) 50 mg, Oral, Daily  . Multiple Vitamin (MULTIVITAMIN WITH MINERALS) TABS tablet 1 tablet, Oral, Daily  . nitroGLYCERIN (NITRODUR - DOSED IN MG/24 HR) 0.4 mg, Transdermal, Daily, Right foot  . pravastatin (PRAVACHOL) 20 mg, Oral, Daily   Radiology:   No results found.  Cardiac Studies:   Echocardiogram 05/02/2016: Left ventricle cavity is small. Cavitary obliteration noted and suspect intraventricular PG. Mild concentric  remodeling of the left ventricle. Basal septal hypertrophy. Normal global wall motion. Doppler evidence of grade II (pseudonormal) diastolic dysfunction, elevated LAP. Calculated EF 74%. Mild (Grade I) mitral regurgitation. No evidence of mitral valve stenosis. Moderate tricuspid regurgitation. Mild pulmonary hypertension. Pulmonary artery systolic pressure is estimated at 33 mm Hg. Mild pulmonic regurgitation.  Peripheral arteriogram 10/27/2018:  Right SFA tandem 95% mid stenosis, 1%) atrial artery occlusion.  One-vessel runoff in the form of right anterior tibial.  Successful PTCA with 5.0 X 200 mm In.Pact DCB left distal superficial femoral artery/proximal left poppliteal artery and 5.0 X 60 mm In.Pact DCB left proximal superficial femoral artery      11/24/2018: Diffuse disease in the left SFA from 60-95% stenosis, diffuse disease and popliteal vessel up to 40%, occluded left DP trunk, patent left anterior tibial with distal 100% occlusion at the ankle. Right SFA: Tandem 95% stenoses right mid SFA Subtotally occluded rt popliteal artery/ Rt TP trunk, with reconstitution of all three vessels below the knee with diffuse distal disease. Atherectomy (1.5 solid [place]) Rt mid to distal SFA, Rt popliteal artery, Rt TP trunk PTA 3.0 X 200 mm balloon Rt common peroneal artery to Rt mid SFA PTA 5.0 X 150 mm balloon Rt mid to distal SFA 10% residual stenosis  ABI 11/25/2018: Right: Resting right ankle-brachial index indicates noncompressible right lower extremity arteries. The right toe-brachial index is abnormal. Non compressible and ABIs are unreliable. Monophasic ABI 1.15. snd TBI 0.25  Left: Resting left ankle-brachial index indicates mild left lower extremity arterial disease. The left toe-brachial index is abnormal. Monophasic ABI 0.86 and TBI 0.25 Compared to 09/16/18, Right ABI 0.40 and Left ABI 0.60.  Carotid artery duplex 04/05/2019:  Duplex suggests stenosis in the right internal  carotid artery (16-49%).  Stenosis in the right external carotid artery (<50%).  Stenosis in the left external carotid artery (<50%).  Antegrade right vertebral artery flow. Antegrade left vertebral artery flow.  Follow up in one year is appropriate if clinically indicated. External carotid stenosis source of bruit.  Lower Extremity Arterial Duplex 04/05/2019:  Monophasic waveform throughout the right SFA suggests significant (>50%  right proximal SFA stenosis).  Monophasic waveform below the left knee suggests diffuse small vessel  disease below the left knee.  This exam reveals critically decreased perfusion of the  right lower  extremity, noted at the anterior tibial artery level (ABI 0.40) and  moderately decreased perfusion of the left lower extremity, noted at the  post tibial artery level (ABI 0.90).  Compared to 09/16/2018, bilateral  critical decrease in ABI, right 0.40 and left 0.60 is unchanged. However post procedure ABI on 11/25/2018 was 1.15 right and 0.86 left. Consider further work up including angiography.   ABI 05/07/2019:  This exam reveals mildly decreased perfusion of the right lower extremity, noted at the post tibial artery level (ABI 0.89) and mildly decreased perfusion of the left lower extremity, noted at the dorsalis pedis artery level (ABI 0.81).  Compared to 04/05/2019, right ABI 0.40 and left ABI 0.90.  Study suggests improvement in the right ABI post angioplasty.  Lower Extremity Arterial Duplex 07/30/2019:  This exam reveals severely decreased perfusion of the right lower  extremity, noted at the anterior tibial and post tibial artery level (ABI  0.42) and moderately decreased perfusion of the left lower extremity,  noted at the anterior tibial and post tibial artery level (ABI 0.7).  Compared to the study done on 11/25/2018, right ABI has decreased from 1 1.15. Consider complete lower extremity arterial duplex or arteriogram.  Peripheral arteriogram  09/07/2019: Patient has history of [place] 2.0 Solid state Laser of the right mid and distal SFA and Proximal Popliteal artery in March 2021.   The right common femoral artery and proximal SFA and mid SFA are patent.  Patient has history of Mid to distal SFA and right popliteal artery are occluded and there is faint filling of the below-knee vessels in the form of collaterals. Successful PTA and balloon angioplasty of the right mid and distal SFA and popliteal artery and TP trunk and right proximal and mid peroneal artery.  Peroneal artery angioplasty was performed with a 2 mm balloon, the popliteal artery and mid and distal SFA was angioplastied with a 4 mm balloon followed by drug-coated balloon angioplasty with a 5.0 x 200 mm [place].  Excellent result was evident with brisk flow at the level of the knee and brisk collateralization of the right below-knee vessels which are filled via collaterals.  90 mL contrast utilized.  Hemostasis obtained by applying Perclose.  Should be discharged home today, I will discontinue Xarelto for now, I will switch her back to aspirin and Plavix for now.  Principal device is DCB at the popliteal and SFA level and plain balloon angioplasty at the peroneal artery level. Lesion length 180 mm in the SFA and popliteal artery and 80 mm in the right peroneal artery.  EKG  EKG 08/09/2019: Normal sinus rhythm with rate of 70 bpm, normal axis, no evidence of ischemia, normal EKG.   No significant change from 05/03/2019   Assessment     ICD-10-CM   1. PAD (peripheral artery disease) (HCC)  I73.9 PCV ANKLE BRACHIAL INDEX (ABI)  2. Critical limb ischemia with history of revascularization of same extremity  I99.8    Z95.9   3. Hypertension with heart disease  I11.9 isosorbide mononitrate (IMDUR) 60 MG 24 hr tablet     Meds ordered this encounter  Medications  . isosorbide mononitrate (IMDUR) 60 MG 24 hr tablet    Sig: Take 1 tablet (60 mg total) by mouth every evening.     Dispense:  30 tablet    Refill:  3    Medications Discontinued During This Encounter  Medication Reason  . lidocaine (XYLOCAINE) 5 % ointment No longer needed (for PRN  medications)    Recommendations:   EMMAJO BENNETTE  is a  86 y.o. with hypertension, diabetes mellitus type 2 and hypercholesterolemia and PAD presents for post procedure f/u.  Due to severe symptoms of claudication, underwent peripheral arteriogram on 10/27/2018 with angioplasty to right SFA and in a staged fashion angioplasty to left SFA on 11/24/2018.  Few weeks ago she developed small ulceration involving the right foot and also pain in bilateral foot right worse than left especially at night.    She underwent peripheral arteriogram on 04/20/19 with successful angioplasty with Ayuron solid state laser atherectomy followed by balloon PTC of the right distal SFA and popliteal artery and TP trunk.  Due to recent ABI revealing marked abnormality and persistent resting right leg pain, she underwent repeat peripheral arteriogram and successful revascularization of the right SFA and popliteal and TP trunk followed by drug-coated balloon angioplasty on 09/07/2019 and now presents for follow-up.  She has noticed marked improvement in pain, essentially has completely resolved and now she has started to be more active again.  She has warm foot, no further ulceration or bruise discoloration.  Will obtain ABI for baseline and I will see her back in 3 months for follow-up of hypertension and also peripheral arterial disease.  Blood pressure is elevated today but last several visits her blood pressure has been very well controlled.  I have added isosorbide mononitrate 60 mg daily for hypertension and potentially for PAD.  She has a blood pressure cuff at home and she will continue to monitor this.   With regard to glimepiride, patient was advised to take Take 1 1/2 tablet by mouth w breakfast. Take 1 tablet w evening meal twice daily, however  she is only taking 1 tablet twice daily.  She will continue to follow-up with her PCP regarding this.   Adrian Prows, MD, [facility] 09/20/2019, 11:53 AM Office: [phone]

## 2019-09-21 ENCOUNTER — Ambulatory Visit: Payer: Medicare Other

## 2019-09-21 DIAGNOSIS — I739 Peripheral vascular disease, unspecified: Secondary | ICD-10-CM | POA: Diagnosis not present

## 2019-11-10 DIAGNOSIS — Z23 Encounter for immunization: Secondary | ICD-10-CM | POA: Diagnosis not present

## 2019-11-11 ENCOUNTER — Ambulatory Visit: Payer: Medicare Other | Admitting: Cardiology

## 2019-11-13 DIAGNOSIS — Z23 Encounter for immunization: Secondary | ICD-10-CM | POA: Diagnosis not present

## 2019-12-06 ENCOUNTER — Ambulatory Visit: Payer: Medicare Other | Admitting: Cardiology

## 2019-12-06 ENCOUNTER — Other Ambulatory Visit: Payer: Self-pay

## 2019-12-06 ENCOUNTER — Encounter: Payer: Self-pay | Admitting: Cardiology

## 2019-12-06 VITALS — BP 182/68 | HR 64 | Resp 16 | Ht 63.0 in | Wt 148.0 lb

## 2019-12-06 DIAGNOSIS — I119 Hypertensive heart disease without heart failure: Secondary | ICD-10-CM

## 2019-12-06 DIAGNOSIS — I739 Peripheral vascular disease, unspecified: Secondary | ICD-10-CM

## 2019-12-06 DIAGNOSIS — I6523 Occlusion and stenosis of bilateral carotid arteries: Secondary | ICD-10-CM | POA: Diagnosis not present

## 2019-12-06 MED ORDER — AMLODIPINE BESYLATE 5 MG PO TABS: 5.0000 mg | ORAL_TABLET | Freq: Every day | ORAL | 2 refills | Status: DC

## 2019-12-06 NOTE — Progress Notes (Signed)
Primary Physician/Referring:  Clayborn Heronankins, [name], MD  Patient ID: Angela BostonBarbara T Benitez, Benitez    DOB: 02-18-1933, 86 y.o.   MRN: [id number]  Chief Complaint  Patient presents with  . PAD  . Hypertension  . Follow-up    6 month   HPI:    Angela Benitez  with hypertension, diabetes mellitus type 2 and hypercholesterolemia and PAD presents for post procedure f/u.  Due to severe symptoms of claudication, underwent peripheral arteriogram on 10/27/2018 with angioplasty to right SFA and in a staged fashion angioplasty to left SFA on 11/24/2018. Due to pain and recurrence of a small ulceration involving the right foot and also pain in bilateral foot right worse than left, underwent peripheral arteriogram on 04/20/19 with successful angioplasty with Ayuron solid state laser atherectomy followed by balloon PTA of the right distal SFA and popliteal artery and TP trunk.  However due to recurrence of pain and ABI suggesting repeat peripheral arteriogram and successful revascularization of the right SFA and popliteal and TP trunk followed by drug-coated balloon angioplasty on 09/07/2019.  She has had complete resolution of pain and no further ulceration and has resumed all her activities without any limitations.  She denies any chest pain or shortness of breath.  Past Medical History:  Diagnosis Date  . Claudication in peripheral vascular disease (HCC) 10/27/2018  . Diabetes mellitus without complication (HCC)   . Hyperlipidemia   . Hypertension   . Mild mitral regurgitation   . Murmur   . Neuropathy   . PAD (peripheral artery disease) (HCC)    Past Surgical History:  Procedure Laterality Date  . ABDOMINAL AORTOGRAM N/A 10/27/2018   Procedure: ABDOMINAL AORTOGRAM;  Surgeon: Elder NegusPatwardhan, Manish J, MD;  Location: [facility];  Service: Cardiovascular;  Laterality: N/A;  . ABDOMINAL AORTOGRAM W/LOWER EXTREMITY N/A 09/07/2019   Procedure: ABDOMINAL AORTOGRAM W/LOWER EXTREMITY;   Surgeon: Yates DecampGanji, Meer Reindl, MD;  Location: [facility];  Service: Cardiovascular;  Laterality: N/A;  . LOWER EXTREMITY ANGIOGRAPHY Bilateral 10/27/2018   Procedure: LOWER EXTREMITY ANGIOGRAPHY;  Surgeon: Elder NegusPatwardhan, Manish J, MD;  Location: [facility];  Service: Cardiovascular;  Laterality: Bilateral;  . LOWER EXTREMITY ANGIOGRAPHY Right 11/24/2018   Procedure: LOWER EXTREMITY ANGIOGRAPHY;  Surgeon: Elder NegusPatwardhan, Manish J, MD;  Location: [facility];  Service: Cardiovascular;  Laterality: Right;  . LOWER EXTREMITY ANGIOGRAPHY  04/20/2019   Procedure: Lower Extremity Angiography;  Surgeon: Yates DecampGanji, Elvera Almario, MD;  [facility];  Service: Cardiovascular;;  Bilateral limited study  . PERIPHERAL VASCULAR ATHERECTOMY Left 10/27/2018   Procedure: PERIPHERAL VASCULAR ATHERECTOMY;  Surgeon: Elder NegusPatwardhan, Manish J, MD;  Location: [facility];  Service: Cardiovascular;  Laterality: Left;  SFA with DRUG COATED BALLOON  . PERIPHERAL VASCULAR ATHERECTOMY Right 11/24/2018   Procedure: PERIPHERAL VASCULAR ATHERECTOMY;  Surgeon: Elder NegusPatwardhan, Manish J, MD;  Location: [facility];  Service: Cardiovascular;  Laterality: Right;  fem/pop  . PERIPHERAL VASCULAR BALLOON ANGIOPLASTY Right 11/24/2018   Procedure: PERIPHERAL VASCULAR BALLOON ANGIOPLASTY;  Surgeon: Elder NegusPatwardhan, Manish J, MD;  Location: [facility];  Service: Cardiovascular;  Laterality: Right;  fem/pop peroneal  . PERIPHERAL VASCULAR BALLOON ANGIOPLASTY  04/20/2019   Procedure: PERIPHERAL VASCULAR BALLOON ANGIOPLASTY;  Surgeon: Yates DecampGanji, Phoenix Dresser, MD;  Location: [facility];  Service: Cardiovascular;;  With Laser treatment   Family History  Problem Relation Age of Onset  . Stroke Father   . Lung cancer Son  Social History   Tobacco Use  . Smoking status: Never Smoker  . Smokeless tobacco: Never Used  Substance Use Topics  . Alcohol use: No   Marital Status: Widowed  ROS  Review of Systems  Cardiovascular:  Positive for claudication. Negative for chest pain, dyspnea on exertion and leg swelling.  Musculoskeletal: Negative for joint swelling.  Gastrointestinal: Negative for melena.   Objective  Blood pressure (!) 182/68, pulse 64, resp. rate 16, height 5\' 3"  (1.6 m), weight 148 lb (67.1 kg), SpO2 97 %.  Vitals with BMI 12/06/2019 09/20/2019 09/07/2019  Height 5\' 3"  5\' 3"  -  Weight 148 lbs 138 lbs -  BMI 26.22 24.45 -  Systolic 182 167 11/07/2019  Diastolic 68 63 49  Pulse 64 65 63     Physical Exam Constitutional:      General: She is not in acute distress.    Appearance: She is well-developed.  Cardiovascular:     Rate and Rhythm: Normal rate and regular rhythm.     Pulses: Intact distal pulses.          Carotid pulses are on the left side with bruit.      Femoral pulses are 2+ on the right side and 2+ on the left side.      Popliteal pulses are 1+ on the right side and 0 on the left side.       Dorsalis pedis pulses are 0 on the right side and 0 on the left side.       Posterior tibial pulses are 0 on the right side and 0 on the left side.     Heart sounds: Murmur heard.  Systolic murmur is present with a grade of 1/6 at the upper right sternal border.  No gallop.      Comments: Bilateral feet chronic ischemic appearing. Bilateral foot warm. Normal capillary refill. No ulceration. Pulmonary:     Effort: Pulmonary effort is normal. No accessory muscle usage or respiratory distress.     Breath sounds: Normal breath sounds.  Abdominal:     Palpations: Abdomen is soft.    Laboratory examination:   Recent Labs    04/15/19 1311 04/21/19 0104 09/03/19 1049  NA 145* 140 142  K 4.5 4.3 4.5  CL 108* 109 104  CO2 23 24 25   GLUCOSE 58* 159* 136*  BUN 18 19 30*  CREATININE 1.10* 1.15* 1.16*  CALCIUM 9.9 8.6* 10.2  GFRNONAA 46* 44* 43*  GFRAA 53* 51* 50*   CrCl cannot be calculated (Patient's most recent lab result is older than the maximum 21 days allowed.).  CMP Latest Ref Rng & Units  09/03/2019 04/21/2019 04/15/2019  Glucose 65 - 99 mg/dL ) 11/03/2019) 04/23/2019)  BUN 8 - 27 mg/dL 04/17/2019) 19 18  Creatinine 0.57 - 1.00 mg/dL 622(W) 979(G) 92(J)  Sodium 134 - 144 mmol/L 142 140 145(H)  Potassium 3.5 - 5.2 mmol/L 4.5 4.3 4.5  Chloride 96 - 106 mmol/L 104 109 108(H)  CO2 20 - 29 mmol/L 25 24 23   Calcium 8.7 - 10.3 mg/dL 19(E 1.74(Y) 9.9   CBC Latest Ref Rng & Units 09/07/2019 09/03/2019 04/21/2019  WBC 3.4 - 10.8 x10E3/uL - 6.6 9.9  Hemoglobin 11.1 - 15.9 g/dL - 63.1 10.4(L)  Hematocrit 34.0 - 46.6 % - 39.6 31.9(L)  Platelets 150 - 400 K/uL 225 - 194   External labs:   12/14/2018:  Lipid Panel completed 12/14/2018 HDL 58.000 12/14/2018 LDL 04/23/2019 12/14/2018 Cholesterol, total 161.000 12/14/2018 Triglycerides  94.000 12/14/2018  HbA1c 6.3.   10/01/2016: TSH 0.580  Medications and allergies  No Known Allergies   Current Outpatient Medications  Medication Instructions  . acetaminophen (TYLENOL) 500 mg, Oral, Every 6 hours PRN  . amLODipine (NORVASC) 5 mg, Oral, Daily  . aspirin EC 81 mg, Oral, Daily  . clopidogrel (PLAVIX) 75 mg, Oral, Daily  . fosinopril (MONOPRIL) 40 mg, Oral, Daily  . gabapentin (NEURONTIN) 100-200 mg, Oral, Daily at bedtime  . glimepiride (AMARYL) 2 mg, Oral, 2 times daily  . hydrochlorothiazide (HYDRODIURIL) 25 mg, Oral, Daily  . isosorbide mononitrate (IMDUR) 60 mg, Oral, Every evening  . latanoprost (XALATAN) 0.005 % ophthalmic solution 1 drop, Both Eyes, Daily at bedtime  . lidocaine (XYLOCAINE) 5 % ointment 1 application, Topical, As needed  . metFORMIN (GLUCOPHAGE) 1,000 mg, Oral, 2 times daily with meals  . metoprolol tartrate (LOPRESSOR) 50 mg, Oral, Daily  . Multiple Vitamin (MULTIVITAMIN WITH MINERALS) TABS tablet 1 tablet, Oral, Daily  . nitroGLYCERIN (NITRODUR - DOSED IN MG/24 HR) 0.4 mg, Transdermal, Daily, Right foot  . pravastatin (PRAVACHOL) 20 mg, Oral, Daily   Radiology:   No results found.  Cardiac Studies:    Echocardiogram 05/02/2016: Left ventricle cavity is small. Cavitary obliteration noted and suspect intraventricular PG. Mild concentric remodeling of the left ventricle. Basal septal hypertrophy. Normal global wall motion. Doppler evidence of grade II (pseudonormal) diastolic dysfunction, elevated LAP. Calculated EF 74%. Mild (Grade I) mitral regurgitation. No evidence of mitral valve stenosis. Moderate tricuspid regurgitation. Mild pulmonary hypertension. Pulmonary artery systolic pressure is estimated at 33 mm Hg. Mild pulmonic regurgitation.  Peripheral arteriogram 10/27/2018:  Right SFA tandem 95% mid stenosis, 1%) atrial artery occlusion.  One-vessel runoff in the form of right anterior tibial.  Successful PTCA with 5.0 X 200 mm In.Pact DCB left distal superficial femoral artery/proximal left poppliteal artery and 5.0 X 60 mm In.Pact DCB left proximal superficial femoral artery      11/24/2018: Diffuse disease in the left SFA from 60-95% stenosis, diffuse disease and popliteal vessel up to 40%, occluded left DP trunk, patent left anterior tibial with distal 100% occlusion at the ankle. Right SFA: Tandem 95% stenoses right mid SFA Subtotally occluded rt popliteal artery/ Rt TP trunk, with reconstitution of all three vessels below the knee with diffuse distal disease. Atherectomy (1.5 solid [place] Di[place]back) Rt mid to distal SFA, Rt popliteal artery, Rt TP trunk PTA 3.0 X 200 mm balloon Rt common peroneal artery to Rt mid SFA PTA 5.0 X 150 mm balloon Rt mid to distal SFA 10% residual stenosis  ABI 11/25/2018: Right: Resting right ankle-brachial index indicates noncompressible right lower extremity arteries. The right toe-brachial index is abnormal. Non compressible and ABIs are unreliable. Monophasic ABI 1.15. snd TBI 0.25  Left: Resting left ankle-brachial index indicates mild left lower extremity arterial disease. The left toe-brachial index is abnormal. Monophasic ABI 0.86 and TBI  0.25 Compared to 09/16/18, Right ABI 0.40 and Left ABI 0.60.  Carotid artery duplex 04/05/2019:  Duplex suggests stenosis in the right internal carotid artery (16-49%).  Stenosis in the right external carotid artery (<50%).  Stenosis in the left external carotid artery (<50%).  Antegrade right vertebral artery flow. Antegrade left vertebral artery flow.  Follow up in one year is appropriate if clinically indicated. External carotid stenosis source of bruit.  Lower Extremity Arterial Duplex 04/05/2019:  Monophasic waveform throughout the right SFA suggests significant (>50%  right proximal SFA stenosis).  Monophasic waveform below the left knee  suggests diffuse small vessel  disease below the left knee.  This exam reveals critically decreased perfusion of the right lower  extremity, noted at the anterior tibial artery level (ABI 0.40) and  moderately decreased perfusion of the left lower extremity, noted at the  post tibial artery level (ABI 0.90).  Compared to 09/16/2018, bilateral  critical decrease in ABI, right 0.40 and left 0.60 is unchanged. However post procedure ABI on 11/25/2018 was 1.15 right and 0.86 left. Consider further work up including angiography.   ABI 05/07/2019:  This exam reveals mildly decreased perfusion of the right lower extremity, noted at the post tibial artery level (ABI 0.89) and mildly decreased perfusion of the left lower extremity, noted at the dorsalis pedis artery level (ABI 0.81).  Compared to 04/05/2019, right ABI 0.40 and left ABI 0.90.  Study suggests improvement in the right ABI post angioplasty.  Lower Extremity Arterial Duplex 07/30/2019:  This exam reveals severely decreased perfusion of the right lower  extremity, noted at the anterior tibial and post tibial artery level (ABI  0.42) and moderately decreased perfusion of the left lower extremity,  noted at the anterior tibial and post tibial artery level (ABI 0.7).  Compared to the study done on  11/25/2018, right ABI has decreased from 1 1.15. Consider complete lower extremity arterial duplex or arteriogram.  Peripheral arteriogram 09/07/2019: Patient has history of [place] 2.0 Solid state Laser of the right mid and distal SFA and Proximal Popliteal artery in March 2021.   The right common femoral artery and proximal SFA and mid SFA are patent.  Patient has history of Mid to distal SFA and right popliteal artery are occluded and there is faint filling of the below-knee vessels in the form of collaterals. Successful PTA and balloon angioplasty of the right mid and distal SFA and popliteal artery and TP trunk and right proximal and mid peroneal artery.  Peroneal artery angioplasty was performed with a 2 mm balloon, the popliteal artery and mid and distal SFA was angioplastied with a 4 mm balloon followed by drug-coated balloon angioplasty with a 5.0 x 200 mm Ranger.  Excellent result was evident with brisk flow at the level of the knee and brisk collateralization of the right below-knee vessels which are filled via collaterals.  90 mL contrast utilized.  Hemostasis obtained by applying Perclose.  Should be discharged home today, I will discontinue Xarelto for now, I will switch her back to aspirin and Plavix for now.  Principal device is DCB at the popliteal and SFA level and plain balloon angioplasty at the peroneal artery level. Lesion length 180 mm in the SFA and popliteal artery and 80 mm in the right peroneal artery.  ABI 09/21/2019: This exam reveals mildly decreased perfusion of the right lower extremity, noted at the post tibial artery level (ABI 0.80) and mildly decreased perfusion of the left lower extremity, noted at the post tibial artery level (ABI 0.83).  Monophasic waveform at the level of the ankles.  EKG  EKG 12/06/2019: Normal sinus rhythm at rate of 66 bpm, left atrial enlargement, normal axis.  No evidence of ischemia, normal EKG otherwise.   No significant change from  EKG 08/09/2019.  Assessment     ICD-10-CM   1. PAD (peripheral artery disease) (HCC)  I73.9   2. Hypertension with heart disease  I11.9 EKG 12-Lead    amLODipine (NORVASC) 5 MG tablet     Meds ordered this encounter  Medications  . amLODipine (NORVASC) 5 MG tablet  Sig: Take 1 tablet (5 mg total) by mouth daily.    Dispense:  30 tablet    Refill:  2    There are no discontinued medications.  Recommendations:   Angela Benitez  is a  86 y.o.  AA Benitez  with hypertension, diabetes mellitus type 2 and hypercholesterolemia and PAD presents for post procedure f/u.  Due to severe symptoms of claudication, underwent peripheral arteriogram on 10/27/2018 with angioplasty to right SFA and in a staged fashion angioplasty to left SFA on 11/24/2018. Due to pain and recurrence of a small ulceration involving the right foot and also pain in bilateral foot right worse than left, underwent peripheral arteriogram on 04/20/19 with successful angioplasty with Ayuron solid state laser atherectomy followed by balloon PTA of the right distal SFA and popliteal artery and TP trunk.  However due to recurrence of pain and ABI suggesting repeat peripheral arteriogram and successful revascularization of the right SFA and popliteal and TP trunk followed by drug-coated balloon angioplasty on 09/07/2019.  She has had complete resolution of symptoms of claudication and is no leg ulceration.  ABI has improved.  Blood pressure is elevated today, I have added amlodipine 5 mg daily, she has an appointment with Dr. Beverley Fiedler in the next 2 weeks, patient is tolerating this and blood pressure is controlled she can get 90-day prescriptions, but could also increase to 10 mg if necessary as long as there is no significant leg edema.  Otherwise stable from vascular standpoint, I will see her back in 6 months.   Yates Decamp, MD, [facility] 12/06/2019, 2:23 PM Office: [phone]

## 2019-12-15 DIAGNOSIS — Z23 Encounter for immunization: Secondary | ICD-10-CM | POA: Diagnosis not present

## 2019-12-15 DIAGNOSIS — E1142 Type 2 diabetes mellitus with diabetic polyneuropathy: Secondary | ICD-10-CM | POA: Diagnosis not present

## 2019-12-15 DIAGNOSIS — E7800 Pure hypercholesterolemia, unspecified: Secondary | ICD-10-CM | POA: Diagnosis not present

## 2019-12-15 DIAGNOSIS — I10 Essential (primary) hypertension: Secondary | ICD-10-CM | POA: Diagnosis not present

## 2019-12-15 DIAGNOSIS — I739 Peripheral vascular disease, unspecified: Secondary | ICD-10-CM | POA: Diagnosis not present

## 2019-12-15 DIAGNOSIS — Z0000 Encounter for general adult medical examination without abnormal findings: Secondary | ICD-10-CM | POA: Diagnosis not present

## 2019-12-16 ENCOUNTER — Telehealth: Payer: Self-pay

## 2019-12-16 NOTE — Telephone Encounter (Signed)
Pt called to say she never started the Isosorbide you gave her on 09/20/19. Her PCP noticed she wasn't on it. I confirmed with pharmacy she never picked it up. Is this something she can just start?

## 2019-12-17 ENCOUNTER — Other Ambulatory Visit: Payer: Self-pay

## 2019-12-17 DIAGNOSIS — I119 Hypertensive heart disease without heart failure: Secondary | ICD-10-CM

## 2019-12-17 MED ORDER — ISOSORBIDE MONONITRATE ER 60 MG PO TB24: 60.0000 mg | ORAL_TABLET | Freq: Every evening | ORAL | 3 refills | Status: DC

## 2019-12-17 NOTE — Telephone Encounter (Signed)
Yes. It should help with her BP

## 2019-12-22 ENCOUNTER — Other Ambulatory Visit: Payer: Self-pay | Admitting: Cardiology

## 2020-03-03 ENCOUNTER — Other Ambulatory Visit: Payer: Self-pay | Admitting: Cardiology

## 2020-03-03 DIAGNOSIS — I119 Hypertensive heart disease without heart failure: Secondary | ICD-10-CM

## 2020-03-22 ENCOUNTER — Other Ambulatory Visit: Payer: Self-pay | Admitting: Cardiology

## 2020-04-04 ENCOUNTER — Other Ambulatory Visit: Payer: Self-pay | Admitting: Cardiology

## 2020-04-04 DIAGNOSIS — I119 Hypertensive heart disease without heart failure: Secondary | ICD-10-CM

## 2020-04-10 ENCOUNTER — Other Ambulatory Visit: Payer: Medicare Other

## 2020-04-11 ENCOUNTER — Ambulatory Visit: Payer: Medicare Other

## 2020-04-11 ENCOUNTER — Other Ambulatory Visit: Payer: Self-pay

## 2020-04-11 DIAGNOSIS — I6523 Occlusion and stenosis of bilateral carotid arteries: Secondary | ICD-10-CM | POA: Diagnosis not present

## 2020-04-17 NOTE — Progress Notes (Signed)
Moderate disease on the right carotid artery, will consider 6 monthly surveillance on her next office visit.

## 2020-05-04 ENCOUNTER — Other Ambulatory Visit: Payer: Self-pay | Admitting: Cardiology

## 2020-05-04 DIAGNOSIS — I119 Hypertensive heart disease without heart failure: Secondary | ICD-10-CM

## 2020-06-05 ENCOUNTER — Other Ambulatory Visit: Payer: Self-pay | Admitting: Cardiology

## 2020-06-05 DIAGNOSIS — I119 Hypertensive heart disease without heart failure: Secondary | ICD-10-CM

## 2020-06-08 ENCOUNTER — Encounter: Payer: Self-pay | Admitting: Cardiology

## 2020-06-08 ENCOUNTER — Ambulatory Visit: Payer: Medicare Other | Admitting: Cardiology

## 2020-06-08 ENCOUNTER — Other Ambulatory Visit: Payer: Self-pay

## 2020-06-08 VITALS — BP 164/69 | HR 71 | Temp 97.1°F | Resp 17 | Ht 63.0 in | Wt 148.0 lb

## 2020-06-08 DIAGNOSIS — I739 Peripheral vascular disease, unspecified: Secondary | ICD-10-CM | POA: Diagnosis not present

## 2020-06-08 DIAGNOSIS — I6523 Occlusion and stenosis of bilateral carotid arteries: Secondary | ICD-10-CM

## 2020-06-08 DIAGNOSIS — I10 Essential (primary) hypertension: Secondary | ICD-10-CM | POA: Diagnosis not present

## 2020-06-08 DIAGNOSIS — E7800 Pure hypercholesterolemia, unspecified: Secondary | ICD-10-CM

## 2020-06-08 DIAGNOSIS — E1122 Type 2 diabetes mellitus with diabetic chronic kidney disease: Secondary | ICD-10-CM | POA: Diagnosis not present

## 2020-06-08 DIAGNOSIS — N1831 Chronic kidney disease, stage 3a: Secondary | ICD-10-CM | POA: Diagnosis not present

## 2020-06-08 MED ORDER — HYDRALAZINE HCL 25 MG PO TABS: 25.0000 mg | ORAL_TABLET | Freq: Three times a day (TID) | ORAL | 3 refills | Status: DC

## 2020-06-08 MED ORDER — ROSUVASTATIN CALCIUM 20 MG PO TABS: 20.0000 mg | ORAL_TABLET | Freq: Every day | ORAL | 2 refills | Status: DC

## 2020-06-08 NOTE — Progress Notes (Signed)
Primary Physician/Referring:  Clayborn Heron, MD  Patient ID: Angela Benitez, female    DOB: 07/02/1933, 87 y.o.   MRN: [id number]  Chief Complaint  Patient presents with  . Follow-up    6 month  . PAD  . Hypertension   HPI:    Angela Benitez  is a 87 y.o. AA female with hypertension, diabetes mellitus type 2 and hypercholesterolemia and PAD presents for post procedure f/u.  Due to severe symptoms of claudication, underwent peripheral arteriogram on 10/27/2018 with angioplasty to right SFA and in a staged fashion angioplasty to left SFA on 11/24/2018. Due to pain and recurrence of a small ulceration involving the right foot and also pain in bilateral foot right worse than left, underwent peripheral arteriogram on 04/20/19 with successful angioplasty with Ayuron solid state laser atherectomy followed by balloon PTA of the right distal SFA and popliteal artery and TP trunk.  However due to recurrence of pain and ABI suggesting repeat peripheral arteriogram and successful revascularization of the right SFA and popliteal and TP trunk followed by drug-coated balloon angioplasty on 09/07/2019.  She has had complete resolution of pain and no further ulceration and has resumed all her activities without any limitations.  She denies any chest pain or shortness of breath. She is presently doing well. Has occasional leg cramps on walking, but no limitations. No chest pain, palpitations or dyspnea.  Past Medical History:  Diagnosis Date  . Claudication in peripheral vascular disease (HCC) 10/27/2018  . Diabetes mellitus without complication (HCC)   . Hyperlipidemia   . Hypertension   . Mild mitral regurgitation   . Murmur   . Neuropathy   . PAD (peripheral artery disease) (HCC)    Past Surgical History:  Procedure Laterality Date  . ABDOMINAL AORTOGRAM N/A 10/27/2018   Procedure: ABDOMINAL AORTOGRAM;  Surgeon: Elder Negus, MD;  Location: [facility];  Service: Cardiovascular;   Laterality: N/A;  . ABDOMINAL AORTOGRAM W/LOWER EXTREMITY N/A 09/07/2019   Procedure: ABDOMINAL AORTOGRAM W/LOWER EXTREMITY;  Surgeon: Yates Decamp, MD;  Location: [facility];  Service: Cardiovascular;  Laterality: N/A;  . LOWER EXTREMITY ANGIOGRAPHY Bilateral 10/27/2018   Procedure: LOWER EXTREMITY ANGIOGRAPHY;  Surgeon: Elder Negus, MD;  Location: [facility];  Service: Cardiovascular;  Laterality: Bilateral;  . LOWER EXTREMITY ANGIOGRAPHY Right 11/24/2018   Procedure: LOWER EXTREMITY ANGIOGRAPHY;  Surgeon: Elder Negus, MD;  Location: [facility];  Service: Cardiovascular;  Laterality: Right;  . LOWER EXTREMITY ANGIOGRAPHY  04/20/2019   Procedure: Lower Extremity Angiography;  Surgeon: Yates Decamp, MD;  Location[facility];  Service: Cardiovascular;;  Bilateral limited study  . PERIPHERAL VASCULAR ATHERECTOMY Left 10/27/2018   Procedure: PERIPHERAL VASCULAR ATHERECTOMY;  Surgeon: Elder Negus, MD;  Location: [facility];  Service: Cardiovascular;  Laterality: Left;  SFA with DRUG COATED BALLOON  . PERIPHERAL VASCULAR ATHERECTOMY Right 11/24/2018   Procedure: PERIPHERAL VASCULAR ATHERECTOMY;  Surgeon: Elder Negus, MD;  Location: [facility];  Service: Cardiovascular;  Laterality: Right;  fem/pop  . PERIPHERAL VASCULAR BALLOON ANGIOPLASTY Right 11/24/2018   Procedure: PERIPHERAL VASCULAR BALLOON ANGIOPLASTY;  Surgeon: Elder Negus, MD;  Location: [facility];  Service: Cardiovascular;  Laterality: Right;  fem/pop peroneal  . PERIPHERAL VASCULAR BALLOON ANGIOPLASTY  04/20/2019   Procedure: PERIPHERAL VASCULAR BALLOON ANGIOPLASTY;  Surgeon: Yates Decamp, MD;  Location: [facility];  Service: Cardiovascular;;  With Laser treatment  Family History  Problem Relation Age of Onset  . Stroke Father   . Lung cancer Son     Social History   Tobacco Use  . Smoking status: Never Smoker  . Smokeless tobacco:  Never Used  Substance Use Topics  . Alcohol use: No   Marital Status: Widowed  ROS  Review of Systems  Cardiovascular: Positive for claudication. Negative for chest pain, dyspnea on exertion and leg swelling.  Musculoskeletal: Negative for joint swelling.  Gastrointestinal: Negative for melena.   Objective  Blood pressure (!) 164/69, pulse 71, temperature (!) 97.1 F (36.2 C), temperature source Temporal, resp. rate 17, height 5\' 3"  (1.6 m), weight 148 lb (67.1 kg), SpO2 98 %.  Vitals with BMI 06/08/2020 06/08/2020 12/06/2019  Height - 5\' 3"  5\' 3"   Weight - 148 lbs 148 lbs  BMI - 26.22 26.22  Systolic 164 154 13/08/2019  Diastolic 69 68 68  Pulse 71 84 64     Physical Exam Constitutional:      General: She is not in acute distress.    Appearance: She is well-developed.  Cardiovascular:     Rate and Rhythm: Normal rate and regular rhythm.     Pulses: Intact distal pulses.          Carotid pulses are on the left side with bruit.      Femoral pulses are 2+ on the right side and 2+ on the left side.      Popliteal pulses are 1+ on the right side and 0 on the left side.       Dorsalis pedis pulses are 0 on the right side and 0 on the left side.       Posterior tibial pulses are 0 on the right side and 0 on the left side.     Heart sounds: Murmur heard.   Systolic murmur is present with a grade of 1/6 at the upper right sternal border. No gallop.      Comments: Bilateral feet chronic ischemic appearing. Bilateral foot warm. Normal capillary refill. No ulceration. Pulmonary:     Effort: Pulmonary effort is normal. No accessory muscle usage or respiratory distress.     Breath sounds: Normal breath sounds.  Abdominal:     Palpations: Abdomen is soft.    Laboratory examination:   Recent Labs    09/03/19 1049  NA 142  K 4.5  CL 104  CO2 25  GLUCOSE 136*  BUN 30*  CREATININE 1.16*  CALCIUM 10.2  GFRNONAA 43*  GFRAA 50*   CrCl cannot be calculated (Patient's most recent lab  result is older than the maximum 21 days allowed.).  CMP Latest Ref Rng & Units 09/03/2019 04/21/2019 04/15/2019  Glucose 65 - 99 mg/dL 11/03/2019) 04/23/2019) 04/17/2019)  BUN 8 - 27 mg/dL 341(P) 19 18  Creatinine 0.57 - 1.00 mg/dL 379(K) 24(O) 97(D)  Sodium 134 - 144 mmol/L 142 140 145(H)  Potassium 3.5 - 5.2 mmol/L 4.5 4.3 4.5  Chloride 96 - 106 mmol/L 104 109 108(H)  CO2 20 - 29 mmol/L 25 24 23   Calcium 8.7 - 10.3 mg/dL 5.32(D 9.24(Q) 9.9   CBC Latest Ref Rng & Units 09/07/2019 09/03/2019 04/21/2019  WBC 3.4 - 10.8 x10E3/uL - 6.6 9.9  Hemoglobin 11.1 - 15.9 g/dL - 2.2(W 10.4(L)  Hematocrit 34.0 - 46.6 % - 39.6 31.9(L)  Platelets 150 - 400 K/uL 225 - 194   External labs:   Labs 04/11/2020:  A1c 7.8%.  Serum glucose 129, BUN  31, creatinine 1.17, CMP otherwise normal.  Labs 12/15/2019:  Total cholesterol 189, triglycerides 88, HDL 69, LDL 104.  Labs 06/23/2019:  12/14/2018: Hb 12.7/HCT 37.4, platelets 262.  Lipid Panel completed 12/14/2018 HDL 58.000 12/14/2018 LDL 56.213 12/14/2018 Cholesterol, total 161.000 12/14/2018 Triglycerides 94.000 12/14/2018  HbA1c 6.3.   10/01/2016: TSH 0.580  Medications and allergies  No Known Allergies   Current Outpatient Medications on File Prior to Visit  Medication Sig Dispense Refill  . acetaminophen (TYLENOL) 500 MG tablet Take 500 mg by mouth every 6 (six) hours as needed for moderate pain or headache.    Marland Kitchen amLODipine (NORVASC) 5 MG tablet Take 1 tablet by mouth once daily 30 tablet 0  . aspirin EC 81 MG tablet Take 81 mg by mouth daily.    . clopidogrel (PLAVIX) 75 MG tablet Take 1 tablet by mouth once daily 90 tablet 0  . fosinopril (MONOPRIL) 40 MG tablet Take 40 mg by mouth daily.     Marland Kitchen gabapentin (NEURONTIN) 100 MG capsule Take 100-200 mg by mouth at bedtime.    Marland Kitchen glimepiride (AMARYL) 2 MG tablet Take 2 mg by mouth in the morning and at bedtime.     . hydrochlorothiazide (HYDRODIURIL) 25 MG tablet Take 25 mg by mouth daily.     . isosorbide  mononitrate (IMDUR) 60 MG 24 hr tablet Take 1 tablet (60 mg total) by mouth every evening. 30 tablet 3  . latanoprost (XALATAN) 0.005 % ophthalmic solution Place 1 drop into both eyes at bedtime.     . metFORMIN (GLUCOPHAGE) 1000 MG tablet Take 1 tablet (1,000 mg total) by mouth 2 (two) times daily with a meal.    . metoprolol tartrate (LOPRESSOR) 50 MG tablet Take 50 mg by mouth daily.     . Multiple Vitamin (MULTIVITAMIN WITH MINERALS) TABS tablet Take 1 tablet by mouth daily.     No current facility-administered medications on file prior to visit.    Radiology:   No results found.  Cardiac Studies:   Echocardiogram 05/02/2016: Left ventricle cavity is small. Cavitary obliteration noted and suspect intraventricular PG. Mild concentric remodeling of the left ventricle. Basal septal hypertrophy. Normal global wall motion. Doppler evidence of grade II (pseudonormal) diastolic dysfunction, elevated LAP. Calculated EF 74%. Mild (Grade I) mitral regurgitation. No evidence of mitral valve stenosis. Moderate tricuspid regurgitation. Mild pulmonary hypertension. Pulmonary artery systolic pressure is estimated at 33 mm Hg. Mild pulmonic regurgitation.  Peripheral arteriogram 10/27/2018:  Right SFA tandem 95% mid stenosis, 1%) atrial artery occlusion.  One-vessel runoff in the form of right anterior tibial.  Successful PTCA with 5.0 X 200 mm In.Pact DCB left distal superficial femoral artery/proximal left poppliteal artery and 5.0 X 60 mm In.Pact DCB left proximal superficial femoral artery      11/24/2018: Diffuse disease in the left SFA from 60-95% stenosis, diffuse disease and popliteal vessel up to 40%, occluded left DP trunk, patent left anterior tibial with distal 100% occlusion at the ankle. Right SFA: Tandem 95% stenoses right mid SFA Subtotally occluded rt popliteal artery/ Rt TP trunk, with reconstitution of all three vessels below the knee with diffuse distal disease. Atherectomy (1.5  solid [place] Diamonback) Rt mid to distal SFA, Rt popliteal artery, Rt TP trunk PTA 3.0 X 200 mm balloon Rt common peroneal artery to Rt mid SFA PTA 5.0 X 150 mm balloon Rt mid to distal SFA 10% residual stenosis  ABI 11/25/2018: Right: Resting right ankle-brachial index indicates noncompressible right lower extremity arteries. The  right toe-brachial index is abnormal. Non compressible and ABIs are unreliable. Monophasic ABI 1.15. snd TBI 0.25  Left: Resting left ankle-brachial index indicates mild left lower extremity arterial disease. The left toe-brachial index is abnormal. Monophasic ABI 0.86 and TBI 0.25 Compared to 09/16/18, Right ABI 0.40 and Left ABI 0.60.  Carotid artery duplex 04/05/2019:  Duplex suggests stenosis in the right internal carotid artery (16-49%).  Stenosis in the right external carotid artery (<50%).  Stenosis in the left external carotid artery (<50%).  Antegrade right vertebral artery flow. Antegrade left vertebral artery flow.  Follow up in one year is appropriate if clinically indicated. External carotid stenosis source of bruit.  Lower Extremity Arterial Duplex 04/05/2019:  Monophasic waveform throughout the right SFA suggests significant (>50%  right proximal SFA stenosis).  Monophasic waveform below the left knee suggests diffuse small vessel  disease below the left knee.  This exam reveals critically decreased perfusion of the right lower  extremity, noted at the anterior tibial artery level (ABI 0.40) and  moderately decreased perfusion of the left lower extremity, noted at the  post tibial artery level (ABI 0.90).  Compared to 09/16/2018, bilateral  critical decrease in ABI, right 0.40 and left 0.60 is unchanged. However post procedure ABI on 11/25/2018 was 1.15 right and 0.86 left. Consider further work up including angiography.  Lower Extremity Arterial Duplex 07/30/2019:  This exam reveals severely decreased perfusion of the right lower  extremity, noted  at the anterior tibial and post tibial artery level (ABI  0.42) and moderately decreased perfusion of the left lower extremity,  noted at the anterior tibial and post tibial artery level (ABI 0.7).  Compared to the study done on 11/25/2018, right ABI has decreased from 1 1.15. Consider complete lower extremity arterial duplex or arteriogram.  Peripheral arteriogram 09/07/2019: Patient has history of A[place]on 2.0 Solid state Laser of the right mid and distal SFA and Proximal Popliteal artery in March 2021.   The right common femoral artery and proximal SFA and mid SFA are patent.  Patient has history of Mid to distal SFA and right popliteal artery are occluded and there is faint filling of the below-knee vessels in the form of collaterals. Successful PTA and balloon angioplasty of the right mid and distal SFA and popliteal artery and TP trunk and right proximal and mid peroneal artery.  Peroneal artery angioplasty was performed with a 2 mm balloon, the popliteal artery and mid and distal SFA was angioplastied with a 4 mm balloon followed by drug-coated balloon angioplasty with a 5.0 x 200 mm Ranger.  Excellent result was evident with brisk flow at the level of the knee and brisk collateralization of the right below-knee vessels which are filled via collaterals.  90 mL contrast utilized.  Hemostasis obtained by applying Perclose.  Should be discharged home today, I will discontinue Xarelto for now, I will switch her back to aspirin and Plavix for now.  Principal device is DCB at the popliteal and SFA level and plain balloon angioplasty at the peroneal artery level. Lesion length 180 mm in the SFA and popliteal artery and 80 mm in the right peroneal artery.  ABI 09/21/2019: This exam reveals mildly decreased perfusion of the right lower extremity, noted at the post tibial artery level (ABI 0.80) and mildly decreased perfusion of the left lower extremity, noted at the post tibial artery level (ABI  0.83).  Monophasic waveform at the level of the ankles.  Carotid artery duplex 04/11/2020: Stenosis in the right  internal carotid artery (50-69%). Stenosis in the right external carotid artery (<50%). Suggests stenosis in the left internal carotid artery (1-15%). Stenosis in the left external carotid artery (<50%). Antegrade right vertebral artery flow. Antegrade left vertebral artery flow. Follow up in six months is appropriate if clinically indicated. Compared to 04/05/2019 study, mild progression of right ICA stenosis.  EKG  EKG 06/08/2020: Normal sinus rhythm at rate of 80 bpm, left atrial enlargement, normal axis.  No evidence of ischemia.  PACs, (2).  No significant change from EKG 12/06/2019   Assessment     ICD-10-CM   1. PAD (peripheral artery disease) (HCC)  I73.9 PCV ANKLE BRACHIAL INDEX (ABI)  2. Primary hypertension  I10 EKG 12-Lead    hydrALAZINE (APRESOLINE) 25 MG tablet  3. Asymptomatic bilateral carotid artery stenosis  I65.23 PCV CAROTID DUPLEX (BILATERAL)  4. Hypercholesterolemia  E78.00 rosuvastatin (CRESTOR) 20 MG tablet    Lipid Panel With LDL/HDL Ratio  5. Type 2 diabetes mellitus with stage 3a chronic kidney disease, without long-term current use of insulin (HCC)  E11.22    N18.31      Meds ordered this encounter  Medications  . rosuvastatin (CRESTOR) 20 MG tablet    Sig: Take 1 tablet (20 mg total) by mouth daily.    Dispense:  30 tablet    Refill:  2    Discontinue Pravastatin  . hydrALAZINE (APRESOLINE) 25 MG tablet    Sig: Take 1 tablet (25 mg total) by mouth 3 (three) times daily.    Dispense:  270 tablet    Refill:  3    Medications Discontinued During This Encounter  Medication Reason  . lidocaine (XYLOCAINE) 5 % ointment No longer needed (for PRN medications)  . nitroGLYCERIN (NITRODUR - DOSED IN MG/24 HR) 0.4 mg/hr patch Error  . lidocaine (XYLOCAINE) 5 % ointment No longer needed (for PRN medications)  . nitroGLYCERIN (NITRODUR - DOSED IN  MG/24 HR) 0.3 mg/hr patch Discontinued by provider  . pravastatin (PRAVACHOL) 20 MG tablet Change in therapy    Recommendations:   Angela Benitez  is a  87 y.o.  AA female  with hypertension, diabetes mellitus type 2 and hypercholesterolemia and PAD presents for post procedure f/u.  Due to severe symptoms of claudication, underwent peripheral arteriogram on 10/27/2018 with angioplasty to right SFA and in a staged fashion angioplasty to left SFA on 11/24/2018. Due to pain and recurrence of a small ulceration involving the right foot and also pain in bilateral foot right worse than left, underwent peripheral arteriogram on 04/20/19 with successful angioplasty with Ayuron solid state laser atherectomy followed by balloon PTA of the right distal SFA and popliteal artery and TP trunk.  However due to recurrence of pain and ABI suggesting repeat peripheral arteriogram and successful revascularization of the right SFA and popliteal and TP trunk followed by drug-coated balloon angioplasty on 09/07/2019.  Patient is here on a 5854-month office visit and follow-up of peripheral arterial disease and hyperlipidemia.  Fortunately she has done well without recurrence of any limb threatening ischemia or ulceration or open wounds in her legs.  There is no activity limitation.  I reviewed her external labs, her diabetes is now uncontrolled and she does admit to eating excessive amounts of fruit especially banana.  She will make changes to this.  Previously diabetes was well controlled.  She has chronic stage III kidney disease that has remained stable.  Her lipids are not well controlled.  We will discontinue pravastatin, will switch  her to Crestor 20 mg daily.  I would like to obtain a lipid profile testing in 3 months.  She needs ABI and also carotid artery duplex for surveillance.  Unless markedly abnormal, I will see her back in 6 months.  Blood pressure is elevated in spite of checking it frequently in our office, will  add hydralazine 25 mg 3 times daily. She has not had any chest pain or dyspnea, no clinical evidence of heart failure.    Yates Decamp, MD, [facility] 06/08/2020, 10:59 AM Office: [phone]

## 2020-06-08 NOTE — Patient Instructions (Signed)
I have changed pravastatin to Crestor 20 mg daily, unclear done with pravastatin, please take 2 tablets of pravastatin for now.  After that please start taking Crestor 20 mg daily.  I would like you to get blood work done sometime in August 1 to 2 week at [facility] to check your cholesterol.  Please avoid grapes, banana, watermelon as your hemoglobin A1c has increased suggesting your diabetes is uncontrolled.  This will affect your arteries in your legs.

## 2020-06-20 ENCOUNTER — Other Ambulatory Visit: Payer: Self-pay | Admitting: Cardiology

## 2020-07-04 ENCOUNTER — Ambulatory Visit: Payer: Medicare Other

## 2020-07-04 ENCOUNTER — Other Ambulatory Visit: Payer: Self-pay

## 2020-07-04 DIAGNOSIS — I739 Peripheral vascular disease, unspecified: Secondary | ICD-10-CM | POA: Diagnosis not present

## 2020-07-05 ENCOUNTER — Other Ambulatory Visit: Payer: Self-pay | Admitting: Cardiology

## 2020-07-05 DIAGNOSIS — I119 Hypertensive heart disease without heart failure: Secondary | ICD-10-CM

## 2020-07-09 NOTE — Progress Notes (Signed)
ABI 07/04/2020: This exam reveals moderately decreased perfusion of the right lower extremity, noted at the anterior tibial artery level (ABI 0.61) and moderately decreased perfusion of the left lower extremity, noted at the anterior tibial and post tibial artery level (ABI 0.67) with severely abnormal monophasic waveform pattern at the level of both ankles. Compared to 09/21/2019, mild decrease in bilateral ABI from right 0.80 and left 0.83.

## 2020-09-13 ENCOUNTER — Other Ambulatory Visit: Payer: Self-pay | Admitting: Cardiology

## 2020-09-13 DIAGNOSIS — E7800 Pure hypercholesterolemia, unspecified: Secondary | ICD-10-CM | POA: Diagnosis not present

## 2020-09-14 LAB — LIPID PANEL WITH LDL/HDL RATIO
Cholesterol, Total: 126 mg/dL (ref 100–199)
HDL: 54 mg/dL (ref >39)
LDL Chol Calc (NIH): 55 mg/dL (ref 0–99)
LDL/HDL Ratio: 1 ratio (ref 0.0–3.2)
Triglycerides: 86 mg/dL (ref 0–149)
VLDL Cholesterol Cal: 17 mg/dL (ref 5–40)

## 2020-09-20 ENCOUNTER — Other Ambulatory Visit: Payer: Self-pay | Admitting: Cardiology

## 2020-09-22 DIAGNOSIS — Z23 Encounter for immunization: Secondary | ICD-10-CM | POA: Diagnosis not present

## 2020-10-03 ENCOUNTER — Other Ambulatory Visit: Payer: Self-pay | Admitting: Cardiology

## 2020-10-03 ENCOUNTER — Other Ambulatory Visit: Payer: Self-pay

## 2020-10-03 ENCOUNTER — Ambulatory Visit: Payer: Medicare Other

## 2020-10-03 DIAGNOSIS — I6523 Occlusion and stenosis of bilateral carotid arteries: Secondary | ICD-10-CM | POA: Diagnosis not present

## 2020-10-03 DIAGNOSIS — I119 Hypertensive heart disease without heart failure: Secondary | ICD-10-CM

## 2020-10-30 DIAGNOSIS — Z23 Encounter for immunization: Secondary | ICD-10-CM | POA: Diagnosis not present

## 2020-11-06 ENCOUNTER — Other Ambulatory Visit: Payer: Self-pay | Admitting: Cardiology

## 2020-11-06 DIAGNOSIS — I119 Hypertensive heart disease without heart failure: Secondary | ICD-10-CM

## 2020-12-08 ENCOUNTER — Ambulatory Visit: Payer: Medicare Other | Admitting: Cardiology

## 2020-12-19 DIAGNOSIS — Z0000 Encounter for general adult medical examination without abnormal findings: Secondary | ICD-10-CM | POA: Diagnosis not present

## 2020-12-19 DIAGNOSIS — I10 Essential (primary) hypertension: Secondary | ICD-10-CM | POA: Diagnosis not present

## 2020-12-19 DIAGNOSIS — R54 Age-related physical debility: Secondary | ICD-10-CM | POA: Diagnosis not present

## 2020-12-19 DIAGNOSIS — I739 Peripheral vascular disease, unspecified: Secondary | ICD-10-CM | POA: Diagnosis not present

## 2020-12-19 DIAGNOSIS — E1142 Type 2 diabetes mellitus with diabetic polyneuropathy: Secondary | ICD-10-CM | POA: Diagnosis not present

## 2020-12-19 DIAGNOSIS — E7800 Pure hypercholesterolemia, unspecified: Secondary | ICD-10-CM | POA: Diagnosis not present

## 2020-12-26 ENCOUNTER — Other Ambulatory Visit: Payer: Self-pay

## 2020-12-26 ENCOUNTER — Ambulatory Visit: Payer: Medicare Other | Admitting: Cardiology

## 2020-12-26 ENCOUNTER — Encounter: Payer: Self-pay | Admitting: Cardiology

## 2020-12-26 VITALS — BP 142/61 | HR 84 | Temp 98.2°F | Resp 17 | Ht 63.0 in | Wt 135.0 lb

## 2020-12-26 DIAGNOSIS — E7800 Pure hypercholesterolemia, unspecified: Secondary | ICD-10-CM | POA: Diagnosis not present

## 2020-12-26 DIAGNOSIS — I739 Peripheral vascular disease, unspecified: Secondary | ICD-10-CM | POA: Diagnosis not present

## 2020-12-26 DIAGNOSIS — E1122 Type 2 diabetes mellitus with diabetic chronic kidney disease: Secondary | ICD-10-CM | POA: Diagnosis not present

## 2020-12-26 DIAGNOSIS — I10 Essential (primary) hypertension: Secondary | ICD-10-CM | POA: Diagnosis not present

## 2020-12-26 DIAGNOSIS — I6523 Occlusion and stenosis of bilateral carotid arteries: Secondary | ICD-10-CM | POA: Diagnosis not present

## 2020-12-26 DIAGNOSIS — N1831 Chronic kidney disease, stage 3a: Secondary | ICD-10-CM

## 2020-12-26 MED ORDER — AMLODIPINE BESYLATE 10 MG PO TABS: 10.0000 mg | ORAL_TABLET | Freq: Every day | ORAL | 3 refills | Status: DC

## 2020-12-26 MED ORDER — CLOPIDOGREL BISULFATE 75 MG PO TABS: 75.0000 mg | ORAL_TABLET | Freq: Every day | ORAL | 3 refills | Status: DC

## 2020-12-26 NOTE — Patient Instructions (Addendum)
For your blood pressure, I have increased the dose of amlodipine from 5 mg to 10 mg daily.  Side effects to look out for would be leg edema/swelling and constipation.  If this occurs please go back to taking 5 mg/day and he can cut the 10 mg tablet to 1/2 tablet daily.  Amlodipine prescription has been sent to local pharmacy at [facility].  Please discontinue aspirin.  You are on clopidogrel and that is sufficient.  A prescription for clopidogrel has been sent to the mail order pharmacy.  It was great to see you today, I will see you back in 6 months.

## 2020-12-26 NOTE — Progress Notes (Signed)
Primary Physician/Referring:  Aretta Nip, MD  Patient ID: Angela Benitez, female    DOB: 1933-08-04, 87 y.o.   MRN: [id number]  Chief Complaint  Patient presents with  . PAD    6 month   HPI:    Angela Benitez  is a 87 y.o. AA female  with hypertension, diabetes mellitus type 2 and hypercholesterolemia and PAD with multiple interventions in the past, last successful revascularization of the right SFA and popliteal and TP trunk followed by drug-coated balloon angioplasty on 09/07/2019.  Patient is here on a 85-month office visit and follow-up of peripheral arterial disease and hyperlipidemia.  She has  resumed all her activities without any limitations and in fact drove to our office today and walked into the office without any limitations.  She denies any chest pain or shortness of breath.   Past Medical History:  Diagnosis Date  . Claudication in peripheral vascular disease ([place]) 10/27/2018  . Diabetes mellitus without complication ([place])   . Hyperlipidemia   . Hypertension   . Mild mitral regurgitation   . Murmur   . Neuropathy   . PAD (peripheral artery disease) ([place])    Past Surgical History:  Procedure Laterality Date  . ABDOMINAL AORTOGRAM N/A 10/27/2018   Procedure: ABDOMINAL AORTOGRAM;  Surgeon: Nigel Mormon, MD;  Location: [place] [facility];  Service: Cardiovascular;  Laterality: N/A;  . ABDOMINAL AORTOGRAM W/LOWER EXTREMITY N/A 09/07/2019   Procedure: ABDOMINAL AORTOGRAM W/LOWER EXTREMITY;  Surgeon: Adrian Prows, MD;  Location: [facility] [facility];  Service: Cardiovascular;  Laterality: N/A;  . LOWER EXTREMITY ANGIOGRAPHY Bilateral 10/27/2018   Procedure: LOWER EXTREMITY ANGIOGRAPHY;  Surgeon: Nigel Mormon, MD;  Location: [place] [facility];  Service: Cardiovascular;  Laterality: Bilateral;  . LOWER EXTREMITY ANGIOGRAPHY Right 11/24/2018   Procedure: LOWER EXTREMITY ANGIOGRAPHY;  Surgeon: Nigel Mormon, MD;  Location: [facility];  Service:  Cardiovascular;  Laterality: Right;  . LOWER EXTREMITY ANGIOGRAPHY  04/20/2019   Procedure: Lower Extremity Angiography;  Surgeon: Adrian Prows, MD;  Location: [facility];  Service: Cardiovascular;;  Bilateral limited study  . PERIPHERAL VASCULAR ATHERECTOMY Left 10/27/2018   Procedure: PERIPHERAL VASCULAR ATHERECTOMY;  Surgeon: Nigel Mormon, MD;  Location: [facility];  Service: Cardiovascular;  Laterality: Left;  SFA with DRUG COATED BALLOON  . PERIPHERAL VASCULAR ATHERECTOMY Right 11/24/2018   Procedure: PERIPHERAL VASCULAR ATHERECTOMY;  Surgeon: Nigel Mormon, MD;  Location: [facility];  Service: Cardiovascular;  Laterality: Right;  fem/pop  . PERIPHERAL VASCULAR BALLOON ANGIOPLASTY Right 11/24/2018   Procedure: PERIPHERAL VASCULAR BALLOON ANGIOPLASTY;  Surgeon: Nigel Mormon, MD;  Location: [facility];  Service: Cardiovascular;  Laterality: Right;  fem/pop peroneal  . PERIPHERAL VASCULAR BALLOON ANGIOPLASTY  04/20/2019   Procedure: PERIPHERAL VASCULAR BALLOON ANGIOPLASTY;  Surgeon: Adrian Prows, MD;  Location: [facility];  Service: Cardiovascular;;  With Laser treatment   Family History  Problem Relation Age of Onset  . Stroke Father   . Lung cancer Son     Social History   Tobacco Use  . Smoking status: Never  . Smokeless tobacco: Never  Substance Use Topics  . Alcohol use: No   Marital Status: Widowed  ROS  Review of Systems  Cardiovascular:  Negative for chest pain, claudication, dyspnea on exertion and leg swelling.  Musculoskeletal:  Negative for joint swelling.  Gastrointestinal:  Negative for melena.  Objective  Blood pressure (!) 142/61, pulse  84, temperature 98.2 F (36.8 C), temperature source Temporal, resp. rate 17, height 5\' 3"  (1.6 m), weight 135 lb (61.2 kg), SpO2 97 %.  Vitals with BMI 12/26/2020 12/26/2020 06/08/2020  Height - 5\' 3"  -  Weight - 135 lbs -  BMI - 99991111 -  Systolic [id number]  Diastolic  61 71 69  Pulse 84 93 71     Physical Exam Constitutional:      General: She is not in acute distress.    Appearance: She is well-developed.  Neck:     Vascular: Carotid bruit (bilateral) present. No JVD.  Cardiovascular:     Rate and Rhythm: Normal rate and regular rhythm.     Pulses:          Femoral pulses are 2+ on the right side and 2+ on the left side.      Popliteal pulses are 1+ on the right side and 0 on the left side.       Dorsalis pedis pulses are 0 on the right side and 0 on the left side.       Posterior tibial pulses are 0 on the right side and 0 on the left side.     Heart sounds: Murmur heard.  Systolic murmur is present with a grade of 1/6 at the upper right sternal border.    No gallop.     Comments: Bilateral feet chronic ischemic appearing. Bilateral foot warm. Normal capillary refill. No ulceration. Pulmonary:     Effort: Pulmonary effort is normal. No accessory muscle usage or respiratory distress.     Breath sounds: Normal breath sounds.  Abdominal:     Palpations: Abdomen is soft.  Musculoskeletal:     Right lower leg: No edema.     Left lower leg: No edema.  Skin:    Capillary Refill: Capillary refill takes less than 2 seconds.   Laboratory examination:   No results for input(s): NA, K, CL, CO2, GLUCOSE, BUN, CREATININE, CALCIUM, GFRNONAA, GFRAA in the last 8760 hours.  CrCl cannot be calculated (Patient's most recent lab result is older than the maximum 21 days allowed.).  CMP Latest Ref Rng & Units 09/03/2019 04/21/2019 04/15/2019  Glucose 65 - 99 mg/dL 136(H) 159(H) 58(L)  BUN 8 - 27 mg/dL 30(H) 19 18  Creatinine 0.57 - 1.00 mg/dL 1.16(H) 1.15(H) 1.10(H)  Sodium 134 - 144 mmol/L 142 140 145(H)  Potassium 3.5 - 5.2 mmol/L 4.5 4.3 4.5  Chloride 96 - 106 mmol/L 104 109 108(H)  CO2 20 - 29 mmol/L 25 24 23   Calcium 8.7 - 10.3 mg/dL 10.2 8.6(L) 9.9   CBC Latest Ref Rng & Units 09/07/2019 09/03/2019 04/21/2019  WBC 3.4 - 10.8 x10E3/uL - 6.6 9.9   Hemoglobin 11.1 - 15.9 g/dL - 12.8 10.4(L)  Hematocrit 34.0 - 46.6 % - 39.6 31.9(L)  Platelets 150 - 400 K/uL 225 - 194   External labs:   Cholesterol, total 145.000 m 12/19/2020 HDL 57.000 mg 12/19/2020 LDL 74.000 mg 12/19/2020 Triglycerides 72.000 mg 12/19/2020  A1C 8.200 % 12/19/2020  Creatinine, Serum 1.330 mg/ 12/19/2020 Potassium 4.100 mm 12/19/2020 ALT (SGPT) 29.000 U/L 12/19/2020  Labs 04/11/2020:  A1c 7.8%.  Serum glucose 129, BUN 31, creatinine 1.17, CMP otherwise normal.  Medications and allergies   Allergies  Allergen Reactions  . Metformin And Related Diarrhea     Current Outpatient Medications on File Prior to Visit  Medication Sig Dispense Refill  . acetaminophen (TYLENOL) 500 MG tablet Take 500 mg  by mouth every 6 (six) hours as needed for moderate pain or headache.    . fosinopril (MONOPRIL) 40 MG tablet Take 40 mg by mouth daily.     Marland Kitchen gabapentin (NEURONTIN) 100 MG capsule Take 100-200 mg by mouth at bedtime.    Marland Kitchen glimepiride (AMARYL) 2 MG tablet Take 2 mg by mouth in the morning and at bedtime.     . hydrALAZINE (APRESOLINE) 25 MG tablet Take 1 tablet (25 mg total) by mouth 3 (three) times daily. 270 tablet 3  . hydrochlorothiazide (HYDRODIURIL) 25 MG tablet Take 25 mg by mouth daily.     . isosorbide mononitrate (IMDUR) 60 MG 24 hr tablet TAKE 1 TABLET BY MOUTH ONCE DAILY IN THE EVENING 90 tablet 0  . latanoprost (XALATAN) 0.005 % ophthalmic solution Place 1 drop into both eyes at bedtime.     . metoprolol tartrate (LOPRESSOR) 50 MG tablet Take 50 mg by mouth 2 (two) times daily.    . Multiple Vitamin (MULTIVITAMIN WITH MINERALS) TABS tablet Take 1 tablet by mouth daily.    . rosuvastatin (CRESTOR) 20 MG tablet Take 1 tablet (20 mg total) by mouth daily. 30 tablet 2   No current facility-administered medications on file prior to visit.    Radiology:   No results found.  Cardiac Studies:   Echocardiogram 05/02/2016: Left ventricle cavity is  small. Cavitary obliteration noted and suspect intraventricular PG. Mild concentric remodeling of the left ventricle. Basal septal hypertrophy. Normal global wall motion. Doppler evidence of grade II (pseudonormal) diastolic dysfunction, elevated LAP. Calculated EF 74%. Mild (Grade I) mitral regurgitation. No evidence of mitral valve stenosis. Moderate tricuspid regurgitation. Mild pulmonary hypertension. Pulmonary artery systolic pressure is estimated at 33 mm Hg. Mild pulmonic regurgitation.  Peripheral arteriogram 10/27/2018:  Right SFA tandem 95% mid stenosis, 1%) atrial artery occlusion.  One-vessel runoff in the form of right anterior tibial.  Successful PTCA with 5.0 X 200 mm In.Pact DCB left distal superficial femoral artery/proximal left poppliteal artery and 5.0 X 60 mm In.Pact DCB left proximal superficial femoral artery      11/24/2018: Diffuse disease in the left SFA from 60-95% stenosis, diffuse disease and popliteal vessel up to 40%, occluded left DP trunk, patent left anterior tibial with distal 100% occlusion at the ankle. Right SFA: Tandem 95% stenoses right mid SFA Subtotally occluded rt popliteal artery/ Rt TP trunk, with reconstitution of all three vessels below the knee with diffuse distal disease. Atherectomy (1.5 solid [place]back) Rt mid to distal SFA, Rt popliteal artery, Rt TP trunk PTA 3.0 X 200 mm balloon Rt common peroneal artery to Rt mid SFA PTA 5.0 X 150 mm balloon Rt mid to distal SFA 10% residual stenosis  Lower Extremity Arterial Duplex 07/30/2019:  This exam reveals severely decreased perfusion of the right lower  extremity, noted at the anterior tibial and post tibial artery level (ABI  0.42) and moderately decreased perfusion of the left lower extremity,  noted at the anterior tibial and post tibial artery level (ABI 0.7).  Compared to the study done on 11/25/2018, right ABI has decreased from 1 1.15. Consider complete lower extremity arterial duplex or  arteriogram.  Peripheral arteriogram 09/07/2019: Patient has history of [place] 2.0 Solid state Laser of the right mid and distal SFA and Proximal Popliteal artery in March 2021.   The right common femoral artery and proximal SFA and mid SFA are patent.  Patient has history of Mid to distal SFA and right popliteal artery are  occluded and there is faint filling of the below-knee vessels in the form of collaterals. Successful PTA and balloon angioplasty of the right mid and distal SFA and popliteal artery and TP trunk and right proximal and mid peroneal artery.  Peroneal artery angioplasty was performed with a 2 mm balloon, the popliteal artery and mid and distal SFA was angioplastied with a 4 mm balloon followed by drug-coated balloon angioplasty with a 5.0 x 200 mm Ranger.   Excellent result was evident with brisk flow at the level of the knee and brisk collateralization of the right below-knee vessels which are filled via collaterals.   90 mL contrast utilized.  Hemostasis obtained by applying Perclose.  Should be discharged home today, I will discontinue Xarelto for now, I will switch her back to aspirin and Plavix for now.   Principal device is DCB at the popliteal and SFA level and plain balloon angioplasty at the peroneal artery level. Lesion length 180 mm in the SFA and popliteal artery and 80 mm in the right peroneal artery.  Carotid artery duplex 10/03/2020: Duplex suggests stenosis in the right external carotid artery (<50%). Duplex suggests stenosis in the left internal carotid artery (1-15%). Duplex suggests stenosis in the left external carotid artery (<50%). Antegrade right vertebral artery flow. Antegrade left vertebral artery flow. Compared to 04/11/2020, right carotid stenosis of 50 to 69% not present.  Study is similar to 04/05/2019.  Follow-up studies when clinically indicated.  ABI 07/04/2020: This exam reveals moderately decreased perfusion of the right lower extremity, noted at  the anterior tibial artery level (ABI 0.61) and moderately decreased perfusion of the left lower extremity, noted at the anterior tibial and post tibial artery level (ABI 0.67) with severely abnormal monophasic waveform pattern at the level of both ankles. Compared to 09/21/2019, mild decrease in bilateral ABI from right 0.80 and left 0.83.  EKG  EKG 12/26/2020: Normal sinus rhythm at rate of 79 bpm, normal axis, single PAC otherwise normal EKG.  No significant change from 06/08/2020.  Assessment     ICD-10-CM   1. PAD (peripheral artery disease) (HCC)  I73.9 clopidogrel (PLAVIX) 75 MG tablet    2. Primary hypertension  I10 EKG 12-Lead    amLODipine (NORVASC) 10 MG tablet    3. Type 2 diabetes mellitus with stage 3a chronic kidney disease, without long-term current use of insulin (HCC)  E11.22    N18.31     4. Hypercholesterolemia  E78.00        Meds ordered this encounter  Medications  . amLODipine (NORVASC) 10 MG tablet    Sig: Take 1 tablet (10 mg total) by mouth daily.    Dispense:  90 tablet    Refill:  3  . clopidogrel (PLAVIX) 75 MG tablet    Sig: Take 1 tablet (75 mg total) by mouth daily.    Dispense:  100 tablet    Refill:  3     Medications Discontinued During This Encounter  Medication Reason  . metFORMIN (GLUCOPHAGE) 1000 MG tablet   . metoprolol tartrate (LOPRESSOR) 50 MG tablet Side effect (s)  . amLODipine (NORVASC) 5 MG tablet   . aspirin EC 81 MG tablet Completed Course  . clopidogrel (PLAVIX) 75 MG tablet Reorder    Recommendations:   Angela Benitez  is a  87 y.o.  AA female  with hypertension, diabetes mellitus type 2 and hypercholesterolemia and PAD with multiple interventions in the past, last successful revascularization of the right SFA and popliteal  and TP trunk followed by drug-coated balloon angioplasty on 09/07/2019.  Patient is here on a 39-month office visit and follow-up of peripheral arterial disease and hyperlipidemia.  Fortunately she has  done well without recurrence of any limb threatening ischemia or ulceration or open wounds in her legs.  There is no activity limitation, her foot is warm and capillary refill is <2 seconds bilaterally.  I reviewed her external labs, her diabetes is uncontrolled, however improved from previous and patient recently discontinued metformin due to frequent diarrhea, we could consider addition of Jardiance if appropriate and she will follow-up with her PCP for this.  Blood pressure is slightly elevated, will increase amlodipine from 5 mg to 10 mg daily.  Otherwise stable from cardiac standpoint I will see her back in 6 months.       Adrian Prows, MD, [facility] 12/26/2020, 4:49 PM Office: [phone]

## 2020-12-30 DIAGNOSIS — N3001 Acute cystitis with hematuria: Secondary | ICD-10-CM | POA: Diagnosis not present

## 2020-12-30 DIAGNOSIS — R8290 Unspecified abnormal findings in urine: Secondary | ICD-10-CM | POA: Diagnosis not present

## 2021-01-09 ENCOUNTER — Other Ambulatory Visit: Payer: Self-pay | Admitting: Cardiology

## 2021-01-09 DIAGNOSIS — E7800 Pure hypercholesterolemia, unspecified: Secondary | ICD-10-CM

## 2021-01-24 DIAGNOSIS — E1142 Type 2 diabetes mellitus with diabetic polyneuropathy: Secondary | ICD-10-CM | POA: Diagnosis not present

## 2021-02-20 DIAGNOSIS — E1142 Type 2 diabetes mellitus with diabetic polyneuropathy: Secondary | ICD-10-CM | POA: Diagnosis not present

## 2021-04-23 DIAGNOSIS — E1142 Type 2 diabetes mellitus with diabetic polyneuropathy: Secondary | ICD-10-CM | POA: Diagnosis not present

## 2021-04-27 DIAGNOSIS — J019 Acute sinusitis, unspecified: Secondary | ICD-10-CM | POA: Diagnosis not present

## 2021-05-28 DIAGNOSIS — E1142 Type 2 diabetes mellitus with diabetic polyneuropathy: Secondary | ICD-10-CM | POA: Diagnosis not present

## 2021-05-28 DIAGNOSIS — R300 Dysuria: Secondary | ICD-10-CM | POA: Diagnosis not present

## 2021-06-01 DIAGNOSIS — E1142 Type 2 diabetes mellitus with diabetic polyneuropathy: Secondary | ICD-10-CM | POA: Diagnosis not present

## 2021-06-18 DIAGNOSIS — E7800 Pure hypercholesterolemia, unspecified: Secondary | ICD-10-CM | POA: Diagnosis not present

## 2021-06-18 DIAGNOSIS — E1142 Type 2 diabetes mellitus with diabetic polyneuropathy: Secondary | ICD-10-CM | POA: Diagnosis not present

## 2021-06-18 DIAGNOSIS — I10 Essential (primary) hypertension: Secondary | ICD-10-CM | POA: Diagnosis not present

## 2021-06-28 ENCOUNTER — Ambulatory Visit: Payer: Medicare Other | Admitting: Cardiology

## 2021-06-28 ENCOUNTER — Encounter: Payer: Self-pay | Admitting: Cardiology

## 2021-06-28 VITALS — BP 139/60 | HR 86 | Temp 98.6°F | Resp 16 | Ht 63.0 in | Wt 125.4 lb

## 2021-06-28 DIAGNOSIS — E7800 Pure hypercholesterolemia, unspecified: Secondary | ICD-10-CM

## 2021-06-28 DIAGNOSIS — I10 Essential (primary) hypertension: Secondary | ICD-10-CM | POA: Diagnosis not present

## 2021-06-28 DIAGNOSIS — N1832 Chronic kidney disease, stage 3b: Secondary | ICD-10-CM | POA: Diagnosis not present

## 2021-06-28 DIAGNOSIS — I739 Peripheral vascular disease, unspecified: Secondary | ICD-10-CM

## 2021-06-28 MED ORDER — HYDRALAZINE HCL 50 MG PO TABS: 50.0000 mg | ORAL_TABLET | Freq: Three times a day (TID) | ORAL | 3 refills | Status: DC

## 2021-06-28 MED ORDER — ISOSORBIDE DINITRATE 30 MG PO TABS: 30.0000 mg | ORAL_TABLET | Freq: Three times a day (TID) | ORAL | 3 refills | Status: DC

## 2021-06-28 NOTE — Progress Notes (Signed)
Primary Physician/Referring:  Aretta Nip, MD  Patient ID: Angela Benitez, female    DOB: 18-Apr-1933, 88 y.o.   MRN: [id number]  Chief Complaint  Patient presents with   PAD   Hypertension   Follow-up    6 months   HPI:    Angela Benitez  is a 88 y.o. AA female  with hypertension, diabetes mellitus type 2 and hypercholesterolemia and PAD with multiple interventions in the past, last successful revascularization of the right SFA and popliteal and TP trunk followed by drug-coated balloon angioplasty on 09/07/2019.  Patient is here on a 86-monthoffice visit and follow-up of peripheral arterial disease and hyperlipidemia.  She has  resumed all her activities without any limitations.  She denies any chest pain or shortness of breath.   Past Medical History:  Diagnosis Date   Claudication in peripheral vascular disease ([place] 10/27/2018   Diabetes mellitus without complication (HCC)    Hyperlipidemia    Hypertension    Mild mitral regurgitation    Murmur    Neuropathy    PAD (peripheral artery disease) (HCC)    Social History   Tobacco Use   Smoking status: Never   Smokeless tobacco: Never  Substance Use Topics   Alcohol use: No   Marital Status: Widowed  ROS  Review of Systems  Cardiovascular:  Negative for chest pain, claudication, dyspnea on exertion and leg swelling.  Musculoskeletal:  Negative for joint swelling.  Gastrointestinal:  Negative for melena.  Objective  Blood pressure 139/60, pulse 86, temperature 98.6 F (37 C), temperature source Temporal, resp. rate 16, height _0  (1.6 m), weight 125 lb 6.4 oz (56.9 kg), SpO2 99 %.     06/28/2021   11:08 AM 06/28/2021   10:58 AM 12/26/2020    4:00 PM  Vitals with BMI  Height  _1    Weight  125 lbs 6 oz   BMI  244.31  Systolic [id number] Diastolic 60 66 61  Pulse 86 93 84     Physical Exam Constitutional:      General: She is not in acute distress.    Appearance: She is well-developed.  Neck:      Vascular: No JVD.  Cardiovascular:     Rate and Rhythm: Normal rate and regular rhythm.     Pulses:          Carotid pulses are  on the right side with bruit and  on the left side with bruit.      Femoral pulses are 2+ on the right side and 2+ on the left side.      Popliteal pulses are 1+ on the right side and 0 on the left side.       Dorsalis pedis pulses are 0 on the right side and 0 on the left side.       Posterior tibial pulses are 0 on the right side and 0 on the left side.     Heart sounds: Murmur heard.  Systolic murmur is present with a grade of 1/6 at the upper right sternal border.    No gallop.     Comments: Bilateral foot warm. Normal capillary refill. No ulceration. Pulmonary:     Effort: Pulmonary effort is normal. No accessory muscle usage or respiratory distress.     Breath sounds: Normal breath sounds.  Abdominal:     General: Bowel sounds are normal.     Palpations: Abdomen is soft.  Musculoskeletal:  Right lower leg: No edema.     Left lower leg: No edema.  Skin:    Capillary Refill: Capillary refill takes less than 2 seconds.   Laboratory examination:  External labs:   Labs 06/01/2021: A1c 7.7%.  BUN 30, creatinine 1.33, EGFR 39 mL, potassium 4.1.  Total cholesterol 145, triglycerides 72, HDL 57, LDL 74, non-HDL cholesterol 72.  Medications and allergies   Allergies  Allergen Reactions   Metformin And Related Diarrhea     Current Outpatient Medications:    acetaminophen (TYLENOL) 500 MG tablet, Take 500 mg by mouth every 6 (six) hours as needed for moderate pain or headache., Disp: , Rfl:    amLODipine (NORVASC) 10 MG tablet, Take 1 tablet (10 mg total) by mouth daily., Disp: 90 tablet, Rfl: 3   clopidogrel (PLAVIX) 75 MG tablet, Take 1 tablet (75 mg total) by mouth daily., Disp: 100 tablet, Rfl: 3   fosinopril (MONOPRIL) 40 MG tablet, Take 40 mg by mouth daily. , Disp: , Rfl:    gabapentin (NEURONTIN) 100 MG capsule, Take 100-200 mg by mouth at  bedtime., Disp: , Rfl:    glipiZIDE (GLUCOTROL XL) 5 MG 24 hr tablet, Take 5 mg by mouth 2 (two) times daily., Disp: , Rfl:    isosorbide dinitrate (ISORDIL) 30 MG tablet, Take 1 tablet (30 mg total) by mouth 3 (three) times daily., Disp: 270 tablet, Rfl: 3   latanoprost (XALATAN) 0.005 % ophthalmic solution, Place 1 drop into both eyes at bedtime. , Disp: , Rfl:    metFORMIN (GLUCOPHAGE-XR) 500 MG 24 hr tablet, Take 500 mg by mouth 2 (two) times daily., Disp: , Rfl:    metoprolol tartrate (LOPRESSOR) 50 MG tablet, Take 50 mg by mouth daily., Disp: , Rfl:    Multiple Vitamin (MULTIVITAMIN WITH MINERALS) TABS tablet, Take 1 tablet by mouth daily., Disp: , Rfl:    rosuvastatin (CRESTOR) 20 MG tablet, Take 1 tablet by mouth once daily, Disp: 90 tablet, Rfl: 1   hydrALAZINE (APRESOLINE) 50 MG tablet, Take 1 tablet (50 mg total) by mouth 3 (three) times daily., Disp: 270 tablet, Rfl: 3    Radiology:   No results found.  Cardiac Studies:   Echocardiogram 05/02/2016: Left ventricle cavity is small. Cavitary obliteration noted and suspect intraventricular PG. Mild concentric remodeling of the left ventricle. Basal septal hypertrophy. Normal global wall motion. Doppler evidence of grade II (pseudonormal) diastolic dysfunction, elevated LAP. Calculated EF 74%. Mild (Grade I) mitral regurgitation. No evidence of mitral valve stenosis. Moderate tricuspid regurgitation. Mild pulmonary hypertension. Pulmonary artery systolic pressure is estimated at 33 mm Hg. Mild pulmonic regurgitation.  Peripheral arteriogram 10/27/2018:  Right SFA tandem 95% mid stenosis, 1%) atrial artery occlusion.  One-vessel runoff in the form of right anterior tibial.  Successful PTCA with 5.0 X 200 mm In.Pact DCB left distal superficial femoral artery/proximal left poppliteal artery and 5.0 X 60 mm In.Pact DCB left proximal superficial femoral artery      11/24/2018: Diffuse disease in the left SFA from 60-95% stenosis, diffuse  disease and popliteal vessel up to 40%, occluded left DP trunk, patent left anterior tibial with distal 100% occlusion at the ankle. Right SFA: Tandem 95% stenoses right mid SFA Subtotally occluded rt popliteal artery/ Rt TP trunk, with reconstitution of all three vessels below the knee with diffuse distal disease. Atherectomy (1.5 solid Crown Diamonback) Rt mid to distal SFA, Rt popliteal artery, Rt TP trunk PTA 3.0 X 200 mm balloon Rt common peroneal artery to  Rt mid SFA PTA 5.0 X 150 mm balloon Rt mid to distal SFA 10% residual stenosis  Lower Extremity Arterial Duplex 07/30/2019:  This exam reveals severely decreased perfusion of the right lower  extremity, noted at the anterior tibial and post tibial artery level (ABI  0.42) and moderately decreased perfusion of the left lower extremity,  noted at the anterior tibial and post tibial artery level (ABI 0.7).  Compared to the study done on 11/25/2018, right ABI has decreased from 1 1.15. Consider complete lower extremity arterial duplex or arteriogram.  Peripheral arteriogram 09/07/2019: Patient has history of [place] 2.0 Solid state Laser of the right mid and distal SFA and Proximal Popliteal artery in March 2021.   The right common femoral artery and proximal SFA and mid SFA are patent.  Patient has history of Mid to distal SFA and right popliteal artery are occluded and there is faint filling of the below-knee vessels in the form of collaterals. Successful PTA and balloon angioplasty of the right mid and distal SFA and popliteal artery and TP trunk and right proximal and mid peroneal artery.  Peroneal artery angioplasty was performed with a 2 mm balloon, the popliteal artery and mid and distal SFA was angioplastied with a 4 mm balloon followed by drug-coated balloon angioplasty with a 5.0 x 200 mm Ranger.   Excellent result was evident with brisk flow at the level of the knee and brisk collateralization of the right below-knee vessels which are  filled via collaterals.   90 mL contrast utilized.  Hemostasis obtained by applying Perclose.  Should be discharged home today, I will discontinue Xarelto for now, I will switch her back to aspirin and Plavix for now.   Principal device is DCB at the popliteal and SFA level and plain balloon angioplasty at the peroneal artery level. Lesion length 180 mm in the SFA and popliteal artery and 80 mm in the right peroneal artery.  Carotid artery duplex 10/03/2020: Duplex suggests stenosis in the right external carotid artery (<50%). Duplex suggests stenosis in the left internal carotid artery (1-15%). Duplex suggests stenosis in the left external carotid artery (<50%). Antegrade right vertebral artery flow. Antegrade left vertebral artery flow. Compared to 04/11/2020, right carotid stenosis of 50 to 69% not present.  Study is similar to 04/05/2019.  Follow-up studies when clinically indicated.  ABI 07/04/2020: This exam reveals moderately decreased perfusion of the right lower extremity, noted at the anterior tibial artery level (ABI 0.61) and moderately decreased perfusion of the left lower extremity, noted at the anterior tibial and post tibial artery level (ABI 0.67) with severely abnormal monophasic waveform pattern at the level of both ankles. Compared to 09/21/2019, mild decrease in bilateral ABI from right 0.80 and left 0.83.  EKG EKG 06/28/2021: Normal sinus rhythm with rate of 79 bpm, normal EKG.  No change from 12/27/2018.  Assessment     ICD-10-CM   1. PAD (peripheral artery disease) (HCC)  I73.9 EKG 12-Lead    isosorbide dinitrate (ISORDIL) 30 MG tablet    2. Primary hypertension  I10 hydrALAZINE (APRESOLINE) 50 MG tablet    isosorbide dinitrate (ISORDIL) 30 MG tablet    3. Hypercholesterolemia  E78.00     4. Stage 3b chronic kidney disease (HCC)  N18.32        Meds ordered this encounter  Medications   hydrALAZINE (APRESOLINE) 50 MG tablet    Sig: Take 1 tablet (50 mg total)  by mouth 3 (three) times daily.    Dispense:  270 tablet    Refill:  3   isosorbide dinitrate (ISORDIL) 30 MG tablet    Sig: Take 1 tablet (30 mg total) by mouth 3 (three) times daily.    Dispense:  270 tablet    Refill:  3    Discontinue Isosorbide mononitrate     Medications Discontinued During This Encounter  Medication Reason   hydrochlorothiazide (HYDRODIURIL) 25 MG tablet    glimepiride (AMARYL) 2 MG tablet Change in therapy   isosorbide mononitrate (IMDUR) 60 MG 24 hr tablet Change in therapy   hydrALAZINE (APRESOLINE) 25 MG tablet     Recommendations:   Angela Benitez  is a  88 y.o.  AA female  with hypertension, diabetes mellitus type 2 and hypercholesterolemia and PAD with multiple interventions in the past, last successful revascularization of the right SFA and popliteal and TP trunk followed by drug-coated balloon angioplasty on 09/07/2019.  Patient is here on a 31-monthoffice visit and follow-up of peripheral arterial disease and hyperlipidemia.  Fortunately she has done well without recurrence of any limb threatening ischemia or ulceration or open wounds in her legs.  There is no activity limitation, her foot is warm and capillary refill is <2 seconds bilaterally.  She is aware to contact me immediately if there is any open wounds.  I reviewed her external labs, her diabetes is uncontrolled, she recently underwent outpatient diabetes education and thinks that this is significantly helped her.  Blood pressure is slightly elevated, I will discontinue isosorbide mononitrate and switch her to isosorbide dinitrate 30 mg 3 times daily along with increasing hydralazine from 25 mg to 50 mg 3 times daily.  I have advised her to make an appointment to see her PCP in 6 to 8 weeks for follow-up of hypertension specifically.  If blood pressure is still elevated, could discontinue metoprolol tartrate and switch her to labetalol 200 mg twice daily.  Otherwise stable from cardiac standpoint I  will see her back in 6 months.       JAdrian Prows MD, [facility]6/01/2021, 11:28 AM Office: [phone]

## 2021-07-08 ENCOUNTER — Encounter: Payer: Self-pay | Admitting: Cardiology

## 2021-07-09 NOTE — Telephone Encounter (Signed)
From patient.

## 2021-07-16 DIAGNOSIS — E119 Type 2 diabetes mellitus without complications: Secondary | ICD-10-CM | POA: Diagnosis not present

## 2021-07-20 ENCOUNTER — Other Ambulatory Visit: Payer: Self-pay | Admitting: Cardiology

## 2021-07-20 DIAGNOSIS — E7800 Pure hypercholesterolemia, unspecified: Secondary | ICD-10-CM

## 2021-08-10 ENCOUNTER — Other Ambulatory Visit: Payer: Self-pay

## 2021-08-10 ENCOUNTER — Telehealth: Payer: Self-pay

## 2021-08-10 DIAGNOSIS — I10 Essential (primary) hypertension: Secondary | ICD-10-CM

## 2021-08-10 MED ORDER — HYDRALAZINE HCL 50 MG PO TABS: 50.0000 mg | ORAL_TABLET | Freq: Three times a day (TID) | ORAL | 0 refills | Status: DC

## 2021-08-10 NOTE — Telephone Encounter (Signed)
Please call pt's daughter Sue Lush to discuss JG increasing hydralazine at last ov

## 2021-08-10 NOTE — Telephone Encounter (Signed)
Called pt daughter, about the message above

## 2021-08-27 ENCOUNTER — Encounter: Payer: Self-pay | Admitting: Cardiology

## 2021-08-27 NOTE — Telephone Encounter (Signed)
From pt

## 2021-09-03 ENCOUNTER — Other Ambulatory Visit: Payer: Self-pay | Admitting: Cardiology

## 2021-09-03 DIAGNOSIS — I119 Hypertensive heart disease without heart failure: Secondary | ICD-10-CM

## 2021-09-05 ENCOUNTER — Other Ambulatory Visit: Payer: Self-pay | Admitting: Cardiology

## 2021-09-05 DIAGNOSIS — I119 Hypertensive heart disease without heart failure: Secondary | ICD-10-CM

## 2021-09-18 DIAGNOSIS — H18413 Arcus senilis, bilateral: Secondary | ICD-10-CM | POA: Diagnosis not present

## 2021-09-18 DIAGNOSIS — H26493 Other secondary cataract, bilateral: Secondary | ICD-10-CM | POA: Diagnosis not present

## 2021-09-18 DIAGNOSIS — H43823 Vitreomacular adhesion, bilateral: Secondary | ICD-10-CM | POA: Diagnosis not present

## 2021-09-18 DIAGNOSIS — H26492 Other secondary cataract, left eye: Secondary | ICD-10-CM | POA: Diagnosis not present

## 2021-09-18 DIAGNOSIS — Z961 Presence of intraocular lens: Secondary | ICD-10-CM | POA: Diagnosis not present

## 2021-09-25 ENCOUNTER — Other Ambulatory Visit: Payer: Self-pay | Admitting: Cardiology

## 2021-09-25 DIAGNOSIS — Z961 Presence of intraocular lens: Secondary | ICD-10-CM | POA: Diagnosis not present

## 2021-09-25 DIAGNOSIS — I10 Essential (primary) hypertension: Secondary | ICD-10-CM

## 2021-10-08 DIAGNOSIS — Z23 Encounter for immunization: Secondary | ICD-10-CM | POA: Diagnosis not present

## 2021-10-11 ENCOUNTER — Other Ambulatory Visit: Payer: Self-pay | Admitting: Cardiology

## 2021-10-11 DIAGNOSIS — E7800 Pure hypercholesterolemia, unspecified: Secondary | ICD-10-CM

## 2021-10-15 ENCOUNTER — Encounter: Payer: Self-pay | Admitting: Cardiology

## 2021-10-15 NOTE — Telephone Encounter (Signed)
From patient.

## 2021-11-14 DIAGNOSIS — Z23 Encounter for immunization: Secondary | ICD-10-CM | POA: Diagnosis not present

## 2021-12-31 ENCOUNTER — Ambulatory Visit: Payer: Medicare Other | Admitting: Cardiology

## 2022-01-01 ENCOUNTER — Encounter: Payer: Self-pay | Admitting: Cardiology

## 2022-01-01 ENCOUNTER — Ambulatory Visit: Payer: Medicare Other | Admitting: Cardiology

## 2022-01-01 VITALS — BP 134/57 | HR 77 | Resp 16 | Ht 63.0 in | Wt 122.4 lb

## 2022-01-01 DIAGNOSIS — E7800 Pure hypercholesterolemia, unspecified: Secondary | ICD-10-CM

## 2022-01-01 DIAGNOSIS — I10 Essential (primary) hypertension: Secondary | ICD-10-CM

## 2022-01-01 DIAGNOSIS — I739 Peripheral vascular disease, unspecified: Secondary | ICD-10-CM | POA: Diagnosis not present

## 2022-01-01 NOTE — Progress Notes (Signed)
Primary Physician/Referring:  Aretta Nip, MD  Patient ID: Angela Benitez, female    DOB: October 11, 1933, 88 y.o.   MRN: [id number]  Chief Complaint  Patient presents with   PAD   Hypertension   Follow-up    6 months   HPI:    Angela Benitez  is a 88 y.o. AA female  with hypertension, diabetes mellitus type 2 and hypercholesterolemia and PAD with multiple interventions in the past, last successful revascularization of the right SFA and popliteal and TP trunk followed by drug-coated balloon angioplasty on 09/07/2019.  Patient is here on a 96-monthoffice visit and follow-up of peripheral arterial disease and hyperlipidemia.  She is now doing all her activities without any limitations.  Denies any symptoms of claudication.  No ulcerations in her feet.  She denies any chest pain or shortness of breath.   Past Medical History:  Diagnosis Date   Claudication in peripheral vascular disease ([place] 10/27/2018   Diabetes mellitus without complication (HCC)    Hyperlipidemia    Hypertension    Mild mitral regurgitation    Murmur    Neuropathy    PAD (peripheral artery disease) (HCC)    Social History   Tobacco Use   Smoking status: Never   Smokeless tobacco: Never  Substance Use Topics   Alcohol use: No   Marital Status: Widowed  ROS  Review of Systems  Cardiovascular:  Negative for chest pain, claudication and dyspnea on exertion.  Gastrointestinal:  Negative for melena.   Objective  Blood pressure (!) 134/57, pulse 77, resp. rate 16, height _0  (1.6 m), weight 122 lb 6.4 oz (55.5 kg), SpO2 96 %.     01/01/2022    1:26 PM 01/01/2022    1:13 PM 06/28/2021   11:08 AM  Vitals with BMI  Height  _1    Weight  122 lbs 6 oz   BMI  208.81  Systolic [id number] Diastolic 57 46 60  Pulse 77 67 86     Physical Exam Constitutional:      Appearance: She is well-developed.  Neck:     Vascular: Carotid bruit (bilateral) present. No JVD.  Cardiovascular:     Rate and Rhythm:  Normal rate and regular rhythm.     Pulses:          Femoral pulses are 2+ on the right side and 2+ on the left side.      Popliteal pulses are 1+ on the right side and 0 on the left side.       Dorsalis pedis pulses are 0 on the right side and 0 on the left side.       Posterior tibial pulses are 0 on the right side and 0 on the left side.     Heart sounds: Murmur heard.     Early systolic murmur is present with a grade of 2/6 at the upper right sternal border.     No gallop.     Comments: Bilateral foot warm. Normal capillary refill. No ulceration. Pulmonary:     Effort: Pulmonary effort is normal. No accessory muscle usage or respiratory distress.     Breath sounds: Normal breath sounds.  Abdominal:     General: Bowel sounds are normal.     Palpations: Abdomen is soft.  Musculoskeletal:     Right lower leg: No edema.     Left lower leg: No edema.  Skin:    Capillary Refill: Capillary  refill takes less than 2 seconds.    Laboratory examination:   External labs:   Labs 06/01/2021: A1c 7.7%.  BUN 30, creatinine 1.33, EGFR 39 mL, potassium 4.1.  Total cholesterol 145, triglycerides 72, HDL 57, LDL 74, non-HDL cholesterol 72.  Medications and allergies   Allergies  Allergen Reactions   Metformin And Related Diarrhea     Current Outpatient Medications:    acetaminophen (TYLENOL) 500 MG tablet, Take 500 mg by mouth every 6 (six) hours as needed for moderate pain or headache., Disp: , Rfl:    amLODipine (NORVASC) 10 MG tablet, Take 1 tablet (10 mg total) by mouth daily., Disp: 90 tablet, Rfl: 3   clopidogrel (PLAVIX) 75 MG tablet, Take 1 tablet (75 mg total) by mouth daily., Disp: 100 tablet, Rfl: 3   fosinopril (MONOPRIL) 40 MG tablet, Take 40 mg by mouth daily. , Disp: , Rfl:    glipiZIDE (GLUCOTROL XL) 5 MG 24 hr tablet, Take 5 mg by mouth 2 (two) times daily., Disp: , Rfl:    hydrALAZINE (APRESOLINE) 50 MG tablet, TAKE 1 TABLET BY MOUTH THREE TIMES DAILY, Disp: 270 tablet,  Rfl: 0   latanoprost (XALATAN) 0.005 % ophthalmic solution, Place 1 drop into both eyes at bedtime. , Disp: , Rfl:    metFORMIN (GLUCOPHAGE-XR) 500 MG 24 hr tablet, Take 500 mg by mouth 2 (two) times daily., Disp: , Rfl:    metoprolol tartrate (LOPRESSOR) 50 MG tablet, Take 50 mg by mouth daily., Disp: , Rfl:    Multiple Vitamin (MULTIVITAMIN WITH MINERALS) TABS tablet, Take 1 tablet by mouth daily., Disp: , Rfl:    rosuvastatin (CRESTOR) 20 MG tablet, Take 1 tablet by mouth once daily, Disp: 90 tablet, Rfl: 0    Radiology:   No results found.  Cardiac Studies:   Echocardiogram 05/02/2016: Left ventricle cavity is small. Cavitary obliteration noted and suspect intraventricular PG. Mild concentric remodeling of the left ventricle. Basal septal hypertrophy. Normal global wall motion. Doppler evidence of grade II (pseudonormal) diastolic dysfunction, elevated LAP. Calculated EF 74%. Mild (Grade I) mitral regurgitation. No evidence of mitral valve stenosis. Moderate tricuspid regurgitation. Mild pulmonary hypertension. Pulmonary artery systolic pressure is estimated at 33 mm Hg. Mild pulmonic regurgitation.  Peripheral arteriogram 10/27/2018:  Right SFA tandem 95% mid stenosis, 1%) atrial artery occlusion.  One-vessel runoff in the form of right anterior tibial.  Successful PTCA with 5.0 X 200 mm In.Pact DCB left distal superficial femoral artery/proximal left poppliteal artery and 5.0 X 60 mm In.Pact DCB left proximal superficial femoral artery      11/24/2018: Diffuse disease in the left SFA from 60-95% stenosis, diffuse disease and popliteal vessel up to 40%, occluded left DP trunk, patent left anterior tibial with distal 100% occlusion at the ankle. Right SFA: Tandem 95% stenoses right mid SFA Subtotally occluded rt popliteal artery/ Rt TP trunk, with reconstitution of all three vessels below the knee with diffuse distal disease. Atherectomy (1.5 solid [place]back) Rt mid to distal SFA,  Rt popliteal artery, Rt TP trunk PTA 3.0 X 200 mm balloon Rt common peroneal artery to Rt mid SFA PTA 5.0 X 150 mm balloon Rt mid to distal SFA 10% residual stenosis  Lower Extremity Arterial Duplex 07/30/2019:  This exam reveals severely decreased perfusion of the right lower  extremity, noted at the anterior tibial and post tibial artery level (ABI  0.42) and moderately decreased perfusion of the left lower extremity,  noted at the anterior tibial and post tibial  artery level (ABI 0.7).  Compared to the study done on 11/25/2018, right ABI has decreased from 1 1.15. Consider complete lower extremity arterial duplex or arteriogram.  Peripheral arteriogram 09/07/2019: Patient has history of Angela Benitez Solid state Laser of the right mid and distal SFA and Proximal Popliteal artery in March 2021.   The right common femoral artery and proximal SFA and mid SFA are patent.  Patient has history of Mid to distal SFA and right popliteal artery are occluded and there is faint filling of the below-knee vessels in the form of collaterals. Successful PTA and balloon angioplasty of the right mid and distal SFA and popliteal artery and TP trunk and right proximal and mid peroneal artery.  Peroneal artery angioplasty was performed with a 2 mm balloon, the popliteal artery and mid and distal SFA was angioplastied with a 4 mm balloon followed by drug-coated balloon angioplasty with a 5.0 x 200 mm Ranger.   Excellent result was evident with brisk flow at the level of the knee and brisk collateralization of the right below-knee vessels which are filled via collaterals.   90 mL contrast utilized.  Hemostasis obtained by applying Perclose.  Should be discharged home today, I will discontinue Xarelto for now, I will switch her back to aspirin and Plavix for now.   Principal device is DCB at the popliteal and SFA level and plain balloon angioplasty at the peroneal artery level. Lesion length 180 mm in the SFA and  popliteal artery and 80 mm in the right peroneal artery.  Carotid artery duplex 10/03/2020: Duplex suggests stenosis in the right external carotid artery (<50%). Duplex suggests stenosis in the left internal carotid artery (1-15%). Duplex suggests stenosis in the left external carotid artery (<50%). Antegrade right vertebral artery flow. Antegrade left vertebral artery flow. Compared to 04/11/2020, right carotid stenosis of 50 to 69% not present.  Study is similar to 04/05/2019.  Follow-up studies when clinically indicated.  ABI 07/04/2020: This exam reveals moderately decreased perfusion of the right lower extremity, noted at the anterior tibial artery level (ABI 0.61) and moderately decreased perfusion of the left lower extremity, noted at the anterior tibial and post tibial artery level (ABI 0.67) with severely abnormal monophasic waveform pattern at the level of both ankles. Compared to 09/21/2019, mild decrease in bilateral ABI from right 0.80 and left 0.83.  EKG  EKG 01/01/2022: Normal sinus rhythm at the rate of 76 bpm, normal axis, low voltage complexes.  No evidence of ischemia.  Occasional PACs.  Compared to prior EKG 06/28/2021, no significant change.  Assessment     ICD-10-CM   1. PAD (peripheral artery disease) (HCC)  I73.9 EKG 12-Lead    2. Primary hypertension  I10     3. Hypercholesterolemia  E78.00        No orders of the defined types were placed in this encounter.    Medications Discontinued During This Encounter  Medication Reason   isosorbide dinitrate (ISORDIL) 30 MG tablet    gabapentin (NEURONTIN) 100 MG capsule     Recommendations:   Angela Benitez  is a  88 y.o.  AA female  with hypertension, diabetes mellitus type 2 and hypercholesterolemia and PAD with multiple interventions in the past, last successful revascularization of the right SFA and popliteal and TP trunk followed by drug-coated balloon angioplasty on 09/07/2019.  1. PAD (peripheral artery  disease) ([place]) This is 38-monthoffice visit, she is presently doing well and does not have any open wounds.  Although she has absent pedal pulses, capillary refill is <2 to 3 seconds.  She has been careful with not to get any injuries.  She is presently on Plavix, continue the same.  2. Primary hypertension Blood pressure is well-controlled on present medical regimen.  No changes were done today.  She does have stage III chronic kidney disease, she needs a repeat lipid profile testing along with routine labs, she is seeing her PCP soon.  3. Hypercholesterolemia Lipids last year were very well-controlled.  She is tolerating statins without any complications.  Continue the same.  She prefers to have her labs done with the PCP.  From vascular standpoint she is doing very well, continue present medications, I will see her back in 6 months for close monitoring.    Adrian Prows, MD, [facility] 01/01/2022, 1:52 PM Office: [phone]

## 2022-01-08 ENCOUNTER — Other Ambulatory Visit: Payer: Self-pay | Admitting: Cardiology

## 2022-01-08 DIAGNOSIS — E7800 Pure hypercholesterolemia, unspecified: Secondary | ICD-10-CM

## 2022-01-09 ENCOUNTER — Other Ambulatory Visit: Payer: Self-pay | Admitting: Cardiology

## 2022-01-09 DIAGNOSIS — I10 Essential (primary) hypertension: Secondary | ICD-10-CM

## 2022-01-24 NOTE — Progress Notes (Signed)
Appointment cancelled

## 2022-02-01 ENCOUNTER — Other Ambulatory Visit: Payer: Self-pay | Admitting: Cardiology

## 2022-02-01 DIAGNOSIS — I10 Essential (primary) hypertension: Secondary | ICD-10-CM

## 2022-02-15 DIAGNOSIS — E1142 Type 2 diabetes mellitus with diabetic polyneuropathy: Secondary | ICD-10-CM | POA: Diagnosis not present

## 2022-02-15 DIAGNOSIS — N1832 Chronic kidney disease, stage 3b: Secondary | ICD-10-CM | POA: Diagnosis not present

## 2022-02-15 DIAGNOSIS — I129 Hypertensive chronic kidney disease with stage 1 through stage 4 chronic kidney disease, or unspecified chronic kidney disease: Secondary | ICD-10-CM | POA: Diagnosis not present

## 2022-02-15 DIAGNOSIS — Z6820 Body mass index (BMI) 20.0-20.9, adult: Secondary | ICD-10-CM | POA: Diagnosis not present

## 2022-02-15 DIAGNOSIS — Z0000 Encounter for general adult medical examination without abnormal findings: Secondary | ICD-10-CM | POA: Diagnosis not present

## 2022-04-03 ENCOUNTER — Other Ambulatory Visit: Payer: Self-pay | Admitting: Cardiology

## 2022-04-03 DIAGNOSIS — E7800 Pure hypercholesterolemia, unspecified: Secondary | ICD-10-CM

## 2022-04-30 ENCOUNTER — Other Ambulatory Visit: Payer: Self-pay

## 2022-04-30 DIAGNOSIS — I739 Peripheral vascular disease, unspecified: Secondary | ICD-10-CM

## 2022-04-30 MED ORDER — CLOPIDOGREL BISULFATE 75 MG PO TABS: 75.0000 mg | ORAL_TABLET | Freq: Every day | ORAL | 3 refills | Status: DC

## 2022-06-30 ENCOUNTER — Other Ambulatory Visit: Payer: Self-pay | Admitting: Cardiology

## 2022-06-30 DIAGNOSIS — E7800 Pure hypercholesterolemia, unspecified: Secondary | ICD-10-CM

## 2022-07-01 ENCOUNTER — Other Ambulatory Visit: Payer: Self-pay | Admitting: Cardiology

## 2022-07-01 DIAGNOSIS — I10 Essential (primary) hypertension: Secondary | ICD-10-CM

## 2022-07-03 ENCOUNTER — Ambulatory Visit: Payer: Medicare Other | Admitting: Cardiology

## 2022-07-03 ENCOUNTER — Encounter: Payer: Self-pay | Admitting: Cardiology

## 2022-07-03 VITALS — BP 150/72 | HR 77 | Resp 16 | Ht 63.0 in | Wt 124.6 lb

## 2022-07-03 DIAGNOSIS — N1831 Chronic kidney disease, stage 3a: Secondary | ICD-10-CM

## 2022-07-03 DIAGNOSIS — I10 Essential (primary) hypertension: Secondary | ICD-10-CM

## 2022-07-03 DIAGNOSIS — I129 Hypertensive chronic kidney disease with stage 1 through stage 4 chronic kidney disease, or unspecified chronic kidney disease: Secondary | ICD-10-CM | POA: Diagnosis not present

## 2022-07-03 DIAGNOSIS — I739 Peripheral vascular disease, unspecified: Secondary | ICD-10-CM

## 2022-07-03 DIAGNOSIS — E7800 Pure hypercholesterolemia, unspecified: Secondary | ICD-10-CM

## 2022-07-03 MED ORDER — VALSARTAN-HYDROCHLOROTHIAZIDE 80-12.5 MG PO TABS: 1.0000 | ORAL_TABLET | ORAL | 2 refills | Status: DC

## 2022-07-03 MED ORDER — LABETALOL HCL 200 MG PO TABS: 200.0000 mg | ORAL_TABLET | Freq: Two times a day (BID) | ORAL | 2 refills | Status: DC

## 2022-07-03 NOTE — Patient Instructions (Signed)
I am starting you on a new medication, discontinue metoprolol, I am switching you to labetalol 200 mg twice daily.  I am also starting you on valsartan HCT 80/12.5 mg in the morning.  After starting the new medications, please go to [facility] in 2 to 3 weeks, no fasting needed.  I would like to see you back in 6 weeks.

## 2022-07-03 NOTE — Progress Notes (Signed)
Primary Physician/Referring:  Clayborn Heron, MD  Patient ID: INDEE GRZESKOWIAK, female    DOB: 1933/06/03, 89 y.o.   MRN: [id number]  Chief Complaint  Patient presents with   PAD        Follow-up    6 month   HPI:    Angela Benitez  is a 89 y.o. AA female  with hypertension, diabetes mellitus type 2 and hypercholesterolemia and PAD with multiple interventions in the past, last successful revascularization of the right SFA and popliteal and TP trunk followed by drug-coated balloon angioplasty on 09/07/2019.  Patient is here on a 66-month office visit and follow-up of peripheral arterial disease and hyperlipidemia.  She is now doing all her activities without any limitations.  Denies any symptoms of claudication.  No ulcerations in her feet.  She denies any chest pain or shortness of breath.  Her main concern has been tingling and numbness and burning sensation in her feet.  Past Medical History:  Diagnosis Date   Claudication in peripheral vascular disease (HCC) 10/27/2018   Diabetes mellitus without complication (HCC)    Hyperlipidemia    Hypertension    Mild mitral regurgitation    Murmur    Neuropathy    PAD (peripheral artery disease) (HCC)    Social History   Tobacco Use   Smoking status: Never   Smokeless tobacco: Never  Substance Use Topics   Alcohol use: No   Marital Status: Widowed  ROS  Review of Systems  Cardiovascular:  Negative for chest pain, claudication and dyspnea on exertion.  Gastrointestinal:  Negative for melena.   Objective  Blood pressure (!) 150/72, pulse 77, resp. rate 16, height 5\' 3"  (1.6 m), weight 124 lb 9.6 oz (56.5 kg), SpO2 97 %.     07/03/2022   10:24 AM 01/01/2022    1:26 PM 01/01/2022    1:13 PM  Vitals with BMI  Height 5\' 3"   5\' 3"   Weight 124 lbs 10 oz  122 lbs 6 oz  BMI 22.08  21.69  Systolic 150 134 119  Diastolic 72 57 46  Pulse 77 77 67     Physical Exam Constitutional:      Appearance: She is well-developed.  Neck:      Vascular: Carotid bruit (bilateral) present. No JVD.  Cardiovascular:     Rate and Rhythm: Normal rate and regular rhythm.     Pulses:          Femoral pulses are 2+ on the right side and 2+ on the left side.      Popliteal pulses are 1+ on the right side and 0 on the left side.       Dorsalis pedis pulses are 0 on the right side and 0 on the left side.       Posterior tibial pulses are 0 on the right side and 0 on the left side.     Heart sounds: Murmur heard.     Early systolic murmur is present with a grade of 2/6 at the upper right sternal border.     No gallop.     Comments: Bilateral foot warm. Normal capillary refill. No ulceration. Pulmonary:     Effort: Pulmonary effort is normal. No accessory muscle usage or respiratory distress.     Breath sounds: Normal breath sounds.  Abdominal:     General: Bowel sounds are normal.     Palpations: Abdomen is soft.  Musculoskeletal:     Right lower  leg: No edema.     Left lower leg: No edema.  Skin:    Capillary Refill: Capillary refill takes less than 2 seconds.    Laboratory examination:   External labs:   Cholesterol, total 131.000 m 02/15/2022 HDL 54.000 mg 02/15/2022 LDL 59.000 mg 02/15/2022 Triglycerides 95.000 mg 02/15/2022  A1C 6.900 mg/ 02/15/2022  Hemoglobin 12.900 g/d 02/15/2022  Creatinine, Serum 1.180 mg/ 02/15/2022 CrCl Est 27.79 02/15/2022 eGFR 45.000 calc 02/15/2022  Potassium 4.000 mm 02/15/2022  ALT (SGPT) 18.000 U/L 02/15/2022  Labs 06/01/2021:  A1c 7.7%.  BUN 30, creatinine 1.33, EGFR 39 mL, potassium 4.1.  Radiology:   No results found.  Cardiac Studies:   Echocardiogram 05/02/2016: Left ventricle cavity is small. Cavitary obliteration noted and suspect intraventricular PG. Mild concentric remodeling of the left ventricle. Basal septal hypertrophy. Normal global wall motion. Doppler evidence of grade II (pseudonormal) diastolic dysfunction, elevated LAP. Calculated EF 74%. Mild (Grade I) mitral  regurgitation. No evidence of mitral valve stenosis. Moderate tricuspid regurgitation. Mild pulmonary hypertension. Pulmonary artery systolic pressure is estimated at 33 mm Hg. Mild pulmonic regurgitation.  Peripheral arteriogram 10/27/2018:  Right SFA tandem 95% mid stenosis, 1%) atrial artery occlusion.  One-vessel runoff in the form of right anterior tibial.  Successful PTCA with 5.0 X 200 mm In.Pact DCB left distal superficial femoral artery/proximal left poppliteal artery and 5.0 X 60 mm In.Pact DCB left proximal superficial femoral artery      11/24/2018: Diffuse disease in the left SFA from 60-95% stenosis, diffuse disease and popliteal vessel up to 40%, occluded left DP trunk, patent left anterior tibial with distal 100% occlusion at the ankle. Right SFA: Tandem 95% stenoses right mid SFA Subtotally occluded rt popliteal artery/ Rt TP trunk, with reconstitution of all three vessels below the knee with diffuse distal disease. Atherectomy (1.5 solid [place] Diamonback) Rt mid to distal SFA, Rt popliteal artery, Rt TP trunk PTA 3.0 X 200 mm balloon Rt common peroneal artery to Rt mid SFA PTA 5.0 X 150 mm balloon Rt mid to distal SFA 10% residual stenosis  Lower Extremity Arterial Duplex 07/30/2019:  This exam reveals severely decreased perfusion of the right lower  extremity, noted at the anterior tibial and post tibial artery level (ABI  0.42) and moderately decreased perfusion of the left lower extremity,  noted at the anterior tibial and post tibial artery level (ABI 0.7).  Compared to the study done on 11/25/2018, right ABI has decreased from 1 1.15. Consider complete lower extremity arterial duplex or arteriogram.  Peripheral arteriogram 09/07/2019: Patient has history of [place] 2.0 Solid state Laser of the right mid and distal SFA and Proximal Popliteal artery in March 2021.   The right common femoral artery and proximal SFA and mid SFA are patent.  Patient has history of Mid to distal  SFA and right popliteal artery are occluded and there is faint filling of the below-knee vessels in the form of collaterals. Successful PTA and balloon angioplasty of the right mid and distal SFA and popliteal artery and TP trunk and right proximal and mid peroneal artery.  Peroneal artery angioplasty was performed with a 2 mm balloon, the popliteal artery and mid and distal SFA was angioplastied with a 4 mm balloon followed by drug-coated balloon angioplasty with a 5.0 x 200 mm Ranger.   Excellent result was evident with brisk flow at the level of the knee and brisk collateralization of the right below-knee vessels which are filled via collaterals.   90 mL contrast  utilized.  Hemostasis obtained by applying Perclose.  Should be discharged home today, I will discontinue Xarelto for now, I will switch her back to aspirin and Plavix for now.   Principal device is DCB at the popliteal and SFA level and plain balloon angioplasty at the peroneal artery level. Lesion length 180 mm in the SFA and popliteal artery and 80 mm in the right peroneal artery.  Carotid artery duplex 10/03/2020: Duplex suggests stenosis in the right external carotid artery (<50%). Duplex suggests stenosis in the left internal carotid artery (1-15%). Duplex suggests stenosis in the left external carotid artery (<50%). Antegrade right vertebral artery flow. Antegrade left vertebral artery flow. Compared to 04/11/2020, right carotid stenosis of 50 to 69% not present.  Study is similar to 04/05/2019.  Follow-up studies when clinically indicated.  ABI 07/04/2020: This exam reveals moderately decreased perfusion of the right lower extremity, noted at the anterior tibial artery level (ABI 0.61) and moderately decreased perfusion of the left lower extremity, noted at the anterior tibial and post tibial artery level (ABI 0.67) with severely abnormal monophasic waveform pattern at the level of both ankles. Compared to 09/21/2019, mild  decrease in bilateral ABI from right 0.80 and left 0.83.  EKG  EKG 07/03/2022: Normal sinus rhythm at a rate of 83 beats minute, left atrial enlargement, frequent PACs (3), otherwise normal EKG.  Compared to 01/01/2022, no significant change.  Medications and allergies   Allergies  Allergen Reactions   Metformin And Related Diarrhea    Current Outpatient Medications:    acetaminophen (TYLENOL) 500 MG tablet, Take 500 mg by mouth every 6 (six) hours as needed for moderate pain or headache., Disp: , Rfl:    amLODipine (NORVASC) 10 MG tablet, Take 1 tablet by mouth once daily, Disp: 90 tablet, Rfl: 1   clopidogrel (PLAVIX) 75 MG tablet, Take 1 tablet (75 mg total) by mouth daily., Disp: 100 tablet, Rfl: 3   gabapentin (NEURONTIN) 300 MG capsule, Take 300 mg by mouth at bedtime., Disp: , Rfl:    glipiZIDE (GLUCOTROL XL) 5 MG 24 hr tablet, Take 5 mg by mouth 2 (two) times daily., Disp: , Rfl:    hydrALAZINE (APRESOLINE) 50 MG tablet, TAKE 1 TABLET BY MOUTH THREE TIMES DAILY, Disp: 270 tablet, Rfl: 2   labetalol (NORMODYNE) 200 MG tablet, Take 1 tablet (200 mg total) by mouth 2 (two) times daily., Disp: 60 tablet, Rfl: 2   latanoprost (XALATAN) 0.005 % ophthalmic solution, Place 1 drop into both eyes at bedtime. , Disp: , Rfl:    metFORMIN (GLUCOPHAGE-XR) 500 MG 24 hr tablet, Take 500 mg by mouth 2 (two) times daily., Disp: , Rfl:    Multiple Vitamin (MULTIVITAMIN WITH MINERALS) TABS tablet, Take 1 tablet by mouth daily., Disp: , Rfl:    rosuvastatin (CRESTOR) 20 MG tablet, Take 1 tablet by mouth once daily, Disp: 90 tablet, Rfl: 0   valsartan-hydrochlorothiazide (DIOVAN HCT) 80-12.5 MG tablet, Take 1 tablet by mouth every morning., Disp: 30 tablet, Rfl: 2   Assessment     ICD-10-CM   1. PAD (peripheral artery disease) (HCC)  I73.9 EKG 12-Lead    2. Primary hypertension  I10 valsartan-hydrochlorothiazide (DIOVAN HCT) 80-12.5 MG tablet    labetalol (NORMODYNE) 200 MG tablet    Basic metabolic  panel    3. Hypercholesterolemia  E78.00     4. Stage 3a chronic kidney disease (HCC)  N18.31        Meds ordered this encounter  Medications  valsartan-hydrochlorothiazide (DIOVAN HCT) 80-12.5 MG tablet    Sig: Take 1 tablet by mouth every morning.    Dispense:  30 tablet    Refill:  2   labetalol (NORMODYNE) 200 MG tablet    Sig: Take 1 tablet (200 mg total) by mouth 2 (two) times daily.    Dispense:  60 tablet    Refill:  2    Discontinue Metoprolol     Medications Discontinued During This Encounter  Medication Reason   fosinopril (MONOPRIL) 40 MG tablet    metoprolol tartrate (LOPRESSOR) 50 MG tablet Change in therapy    Recommendations:   Angela Benitez  is a  89 y.o.  AA female  with hypertension, diabetes mellitus type 2 and hypercholesterolemia and PAD with multiple interventions in the past, last successful revascularization of the right SFA and popliteal and TP trunk followed by drug-coated balloon angioplasty on 09/07/2019.  1. PAD (peripheral artery disease) (HCC) This is 45-month office visit, she is presently doing well and does not have any open wounds.  Although she has absent pedal pulses, capillary refill is <2 to 3 seconds.  She has been careful with not to get any injuries.  She is presently on Plavix, continue the same.  With regard to burning sensation of feet, it is probably related to diabetic peripheral neuropathy, she is presently using Neurontin in the morning, advised her to try in the evening or even twice daily to see how she will do.  2. Primary hypertension Blood pressure is elevated today even after waiting for several minutes, I will discontinue metoprolol succinate and switch her to labetalol 200 mg p.o. twice daily, I will also add valsartan HCT 80/12.5 mg in the morning.  She does have stage III chronic kidney disease, she needs a repeat lipid profile testing along with routine labs, in 2 to 3 weeks for follow-up of renal function.  I would like  to see her back in 6 weeks.  3. Hypercholesterolemia Lipids last year were very well-controlled.  She is tolerating statins without any complications.  Continue the same.  She prefers to have her labs done with the PCP.  4.  Stage IIIa chronic kidney disease Reviewed her labs, renal functions remained stable. From vascular standpoint she is doing very well, continue present medications.   Yates Decamp, MD, [facility] 07/03/2022, 11:32 AM Office: [phone] Fax: [phone] Pager: [phone]

## 2022-07-12 DIAGNOSIS — I10 Essential (primary) hypertension: Secondary | ICD-10-CM | POA: Diagnosis not present

## 2022-07-13 LAB — BASIC METABOLIC PANEL
BUN/Creatinine Ratio: 27 (ref 12–28)
BUN: 39 mg/dL — ABNORMAL HIGH (ref 8–27)
CO2: 23 mmol/L (ref 20–29)
Calcium: 9.8 mg/dL (ref 8.7–10.3)
Chloride: 104 mmol/L (ref 96–106)
Creatinine, Ser: 1.45 mg/dL — ABNORMAL HIGH (ref 0.57–1.00)
Glucose: 92 mg/dL (ref 70–99)
Potassium: 4.6 mmol/L (ref 3.5–5.2)
Sodium: 141 mmol/L (ref 134–144)
eGFR: 35 mL/min/{1.73_m2} — ABNORMAL LOW (ref >59)

## 2022-07-13 NOTE — Progress Notes (Signed)
Mild worsening of kidney function but will keep an eye on this. See you soon

## 2022-08-13 DIAGNOSIS — N1832 Chronic kidney disease, stage 3b: Secondary | ICD-10-CM | POA: Diagnosis not present

## 2022-08-13 DIAGNOSIS — E1142 Type 2 diabetes mellitus with diabetic polyneuropathy: Secondary | ICD-10-CM | POA: Diagnosis not present

## 2022-08-13 DIAGNOSIS — M25669 Stiffness of unspecified knee, not elsewhere classified: Secondary | ICD-10-CM | POA: Diagnosis not present

## 2022-08-13 DIAGNOSIS — E1122 Type 2 diabetes mellitus with diabetic chronic kidney disease: Secondary | ICD-10-CM | POA: Diagnosis not present

## 2022-08-13 DIAGNOSIS — Z6823 Body mass index (BMI) 23.0-23.9, adult: Secondary | ICD-10-CM | POA: Diagnosis not present

## 2022-08-14 ENCOUNTER — Ambulatory Visit: Payer: Medicare Other | Admitting: Cardiology

## 2022-08-14 ENCOUNTER — Encounter: Payer: Self-pay | Admitting: Cardiology

## 2022-08-14 VITALS — BP 131/51 | HR 69 | Resp 16 | Ht 63.0 in | Wt 129.0 lb

## 2022-08-14 DIAGNOSIS — N1832 Chronic kidney disease, stage 3b: Secondary | ICD-10-CM

## 2022-08-14 DIAGNOSIS — I739 Peripheral vascular disease, unspecified: Secondary | ICD-10-CM | POA: Diagnosis not present

## 2022-08-14 DIAGNOSIS — E7800 Pure hypercholesterolemia, unspecified: Secondary | ICD-10-CM

## 2022-08-14 DIAGNOSIS — I10 Essential (primary) hypertension: Secondary | ICD-10-CM | POA: Diagnosis not present

## 2022-08-14 MED ORDER — LABETALOL HCL 200 MG PO TABS
200.0000 mg | ORAL_TABLET | Freq: Two times a day (BID) | ORAL | 3 refills | Status: DC
Start: 2022-08-14 — End: 2023-09-15

## 2022-08-14 MED ORDER — VALSARTAN-HYDROCHLOROTHIAZIDE 80-12.5 MG PO TABS
1.0000 | ORAL_TABLET | ORAL | 3 refills | Status: DC
Start: 2022-08-14 — End: 2023-08-18

## 2022-08-14 NOTE — Progress Notes (Signed)
Primary Physician/Referring:  Sigmund Hazel, MD  Patient ID: Angela Benitez, female    DOB: February 07, 1933, 89 y.o.   MRN: [id number]  Chief Complaint  Patient presents with   Hypertension   Follow-up   HPI:    Angela Benitez  is a 89 y.o. AA female  with hypertension, diabetes mellitus type 2 and hypercholesterolemia and PAD with multiple interventions in the past, last successful revascularization of the right SFA and popliteal and TP trunk followed by drug-coated balloon angioplasty on 09/07/2019.  Patient is here on a 6-week office visit and follow-up of peripheral arterial disease and hypertension.  She is tolerating the medication that started including labetalol and valsartan HCT.  She is now doing all her activities without any limitations.  Denies any symptoms of claudication.  No ulcerations in her feet.  She denies any chest pain or shortness of breath.  Patient states that since medication changes, her blood pressure has been under excellent control. Past Medical History:  Diagnosis Date   Claudication in peripheral vascular disease (HCC) 10/27/2018   Diabetes mellitus without complication (HCC)    Hyperlipidemia    Hypertension    Mild mitral regurgitation    Murmur    Neuropathy    PAD (peripheral artery disease) (HCC)    Social History   Tobacco Use   Smoking status: Never   Smokeless tobacco: Never  Substance Use Topics   Alcohol use: No   Marital Status: Widowed  ROS  Review of Systems  Cardiovascular:  Negative for chest pain, claudication and dyspnea on exertion.  Gastrointestinal:  Negative for melena.   Objective  Blood pressure (!) 131/51, pulse 69, resp. rate 16, height 5\' 3"  (1.6 m), weight 129 lb (58.5 kg), SpO2 97%.     08/14/2022   10:43 AM 07/03/2022   10:24 AM 01/01/2022    1:26 PM  Vitals with BMI  Height 5\' 3"  5\' 3"    Weight 129 lbs 124 lbs 10 oz   BMI 22.86 22.08   Systolic 131 150 284  Diastolic 51 72 57  Pulse 69 77 77     Physical  Exam Constitutional:      Appearance: She is well-developed.  Neck:     Vascular: Carotid bruit (bilateral) present. No JVD.  Cardiovascular:     Rate and Rhythm: Normal rate and regular rhythm.     Pulses:          Femoral pulses are 2+ on the right side and 2+ on the left side.      Popliteal pulses are 1+ on the right side and 0 on the left side.       Dorsalis pedis pulses are 0 on the right side and 0 on the left side.       Posterior tibial pulses are 0 on the right side and 0 on the left side.     Heart sounds: Murmur heard.     Early systolic murmur is present with a grade of 2/6 at the upper right sternal border.     No gallop.     Comments: Bilateral foot warm. Normal capillary refill. No ulceration. Pulmonary:     Effort: Pulmonary effort is normal. No accessory muscle usage or respiratory distress.     Breath sounds: Normal breath sounds.  Abdominal:     General: Bowel sounds are normal.     Palpations: Abdomen is soft.  Musculoskeletal:     Right lower leg: No edema.  Left lower leg: No edema.  Skin:    Capillary Refill: Capillary refill takes less than 2 seconds.    Laboratory examination:    Lab Results  Component Value Date   NA 141 07/12/2022   K 4.6 07/12/2022   CO2 23 07/12/2022   GLUCOSE 92 07/12/2022   BUN 39 (H) 07/12/2022   CREATININE 1.45 (H) 07/12/2022   CALCIUM 9.8 07/12/2022   EGFR 35 (L) 07/12/2022   GFRNONAA 43 (L) 09/03/2019      Latest Ref Rng & Units 07/12/2022    9:05 AM 09/03/2019   10:49 AM 04/21/2019    1:04 AM  BMP  Glucose 70 - 99 mg/dL 92  161  096   BUN 8 - 27 mg/dL 39  30  19   Creatinine 0.57 - 1.00 mg/dL 0.45  4.09  8.11   BUN/Creat Ratio 12 - 28 27  26     Sodium 134 - 144 mmol/L 141  142  140   Potassium 3.5 - 5.2 mmol/L 4.6  4.5  4.3   Chloride 96 - 106 mmol/L 104  104  109   CO2 20 - 29 mmol/L 23  25  24    Calcium 8.7 - 10.3 mg/dL 9.8  91.4  8.6     External labs:   Labs 08/13/2022:  A1c 7.0%.  TSH normal at  1.52.  Total cholesterol 131, triglycerides 95, HDL 54, LDL 59.  Serum glucose 112 mg, BUN 44, creatinine 1.38, EGFR 37 mL, potassium 4.4.  LFTs normal.  Creatinine, Serum 1.180 mg/ 02/15/2022 CrCl Est 27.79 02/15/2022 eGFR 45.000 calc 02/15/2022  Potassium 4.000 mm 02/15/2022  ALT (SGPT) 18.000 U/L 02/15/2022  Labs 06/01/2021:  A1c 7.7%.  BUN 30, creatinine 1.33, EGFR 39 mL, potassium 4.1.  Radiology:   No results found.  Cardiac Studies:   Echocardiogram 05/02/2016: Left ventricle cavity is small. Cavitary obliteration noted and suspect intraventricular PG. Mild concentric remodeling of the left ventricle. Basal septal hypertrophy. Normal global wall motion. Doppler evidence of grade II (pseudonormal) diastolic dysfunction, elevated LAP. Calculated EF 74%. Mild (Grade I) mitral regurgitation. No evidence of mitral valve stenosis. Moderate tricuspid regurgitation. Mild pulmonary hypertension. Pulmonary artery systolic pressure is estimated at 33 mm Hg. Mild pulmonic regurgitation.  Peripheral arteriogram 10/27/2018:  Right SFA tandem 95% mid stenosis, 1%) atrial artery occlusion.  One-vessel runoff in the form of right anterior tibial.  Successful PTCA with 5.0 X 200 mm In.Pact DCB left distal superficial femoral artery/proximal left poppliteal artery and 5.0 X 60 mm In.Pact DCB left proximal superficial femoral artery      11/24/2018: Diffuse disease in the left SFA from 60-95% stenosis, diffuse disease and popliteal vessel up to 40%, occluded left DP trunk, patent left anterior tibial with distal 100% occlusion at the ankle. Right SFA: Tandem 95% stenoses right mid SFA Subtotally occluded rt popliteal artery/ Rt TP trunk, with reconstitution of all three vessels below the knee with diffuse distal disease. Atherectomy (1.5 solid [place] Diamonback) Rt mid to distal SFA, Rt popliteal artery, Rt TP trunk PTA 3.0 X 200 mm balloon Rt common peroneal artery to Rt mid SFA PTA 5.0 X 150 mm  balloon Rt mid to distal SFA 10% residual stenosis  Lower Extremity Arterial Duplex 07/30/2019:  This exam reveals severely decreased perfusion of the right lower  extremity, noted at the anterior tibial and post tibial artery level (ABI  0.42) and moderately decreased perfusion of the left lower extremity,  noted at the anterior  tibial and post tibial artery level (ABI 0.7).  Compared to the study done on 11/25/2018, right ABI has decreased from 1 1.15. Consider complete lower extremity arterial duplex or arteriogram.  Peripheral arteriogram 09/07/2019: Patient has history of [place] 2.0 Solid state Laser of the right mid and distal SFA and Proximal Popliteal artery in March 2021.   The right common femoral artery and proximal SFA and mid SFA are patent.  Patient has history of Mid to distal SFA and right popliteal artery are occluded and there is faint filling of the below-knee vessels in the form of collaterals. Successful PTA and balloon angioplasty of the right mid and distal SFA and popliteal artery and TP trunk and right proximal and mid peroneal artery.  Peroneal artery angioplasty was performed with a 2 mm balloon, the popliteal artery and mid and distal SFA was angioplastied with a 4 mm balloon followed by drug-coated balloon angioplasty with a 5.0 x 200 mm Ranger.   Excellent result was evident with brisk flow at the level of the knee and brisk collateralization of the right below-knee vessels which are filled via collaterals.   90 mL contrast utilized.  Hemostasis obtained by applying Perclose.  Should be discharged home today, I will discontinue Xarelto for now, I will switch her back to aspirin and Plavix for now.   Principal device is DCB at the popliteal and SFA level and plain balloon angioplasty at the peroneal artery level. Lesion length 180 mm in the SFA and popliteal artery and 80 mm in the right peroneal artery.  Carotid artery duplex 10/03/2020: Duplex suggests stenosis in  the right external carotid artery (<50%). Duplex suggests stenosis in the left internal carotid artery (1-15%). Duplex suggests stenosis in the left external carotid artery (<50%). Antegrade right vertebral artery flow. Antegrade left vertebral artery flow. Compared to 04/11/2020, right carotid stenosis of 50 to 69% not present.  Study is similar to 04/05/2019.  Follow-up studies when clinically indicated.  ABI 07/04/2020: This exam reveals moderately decreased perfusion of the right lower extremity, noted at the anterior tibial artery level (ABI 0.61) and moderately decreased perfusion of the left lower extremity, noted at the anterior tibial and post tibial artery level (ABI 0.67) with severely abnormal monophasic waveform pattern at the level of both ankles. Compared to 09/21/2019, mild decrease in bilateral ABI from right 0.80 and left 0.83.  EKG  EKG 07/03/2022: Normal sinus rhythm at a rate of 83 beats minute, left atrial enlargement, frequent PACs (3), otherwise normal EKG.  Compared to 01/01/2022, no significant change.  Medications and allergies   Allergies  Allergen Reactions   Metformin And Related Diarrhea    Current Outpatient Medications:    acetaminophen (TYLENOL) 500 MG tablet, Take 500 mg by mouth every 6 (six) hours as needed for moderate pain or headache., Disp: , Rfl:    amLODipine (NORVASC) 10 MG tablet, Take 1 tablet by mouth once daily, Disp: 90 tablet, Rfl: 0   clopidogrel (PLAVIX) 75 MG tablet, Take 1 tablet (75 mg total) by mouth daily., Disp: 100 tablet, Rfl: 3   gabapentin (NEURONTIN) 300 MG capsule, Take 300 mg by mouth at bedtime., Disp: , Rfl:    glipiZIDE (GLUCOTROL XL) 5 MG 24 hr tablet, Take 5 mg by mouth 2 (two) times daily., Disp: , Rfl:    hydrALAZINE (APRESOLINE) 50 MG tablet, TAKE 1 TABLET BY MOUTH THREE TIMES DAILY, Disp: 270 tablet, Rfl: 2   latanoprost (XALATAN) 0.005 % ophthalmic solution, Place 1 drop  into both eyes at bedtime. , Disp: , Rfl:     metFORMIN (GLUCOPHAGE-XR) 500 MG 24 hr tablet, Take 500 mg by mouth daily., Disp: , Rfl:    Multiple Vitamin (MULTIVITAMIN WITH MINERALS) TABS tablet, Take 1 tablet by mouth daily., Disp: , Rfl:    rosuvastatin (CRESTOR) 20 MG tablet, Take 1 tablet by mouth once daily, Disp: 90 tablet, Rfl: 0   labetalol (NORMODYNE) 200 MG tablet, Take 1 tablet (200 mg total) by mouth 2 (two) times daily., Disp: 180 tablet, Rfl: 3   valsartan-hydrochlorothiazide (DIOVAN HCT) 80-12.5 MG tablet, Take 1 tablet by mouth every morning., Disp: 90 tablet, Rfl: 3   Assessment     ICD-10-CM   1. Primary hypertension  I10 labetalol (NORMODYNE) 200 MG tablet    valsartan-hydrochlorothiazide (DIOVAN HCT) 80-12.5 MG tablet    2. PAD (peripheral artery disease) (HCC)  I73.9     3. Stage 3b chronic kidney disease (HCC)  N18.32     4. Hypercholesterolemia  E78.00        Meds ordered this encounter  Medications   labetalol (NORMODYNE) 200 MG tablet    Sig: Take 1 tablet (200 mg total) by mouth 2 (two) times daily.    Dispense:  180 tablet    Refill:  3    Discontinue Metoprolol   valsartan-hydrochlorothiazide (DIOVAN HCT) 80-12.5 MG tablet    Sig: Take 1 tablet by mouth every morning.    Dispense:  90 tablet    Refill:  3     Medications Discontinued During This Encounter  Medication Reason   aspirin 81 MG chewable tablet Discontinued by provider   valsartan-hydrochlorothiazide (DIOVAN HCT) 80-12.5 MG tablet Reorder   labetalol (NORMODYNE) 200 MG tablet Reorder     Recommendations:   Angela Benitez  is a  89 y.o.  AA female  with hypertension, diabetes mellitus type 2 and hypercholesterolemia and PAD with multiple interventions in the past, last successful revascularization of the right SFA and popliteal and TP trunk followed by drug-coated balloon angioplasty on 09/07/2019.  1. Primary hypertension On her last office visit about 2 months ago and started on labetalol and also valsartan HCT and discontinued  metoprolol.  Today the blood pressure is under excellent control.  She is also tolerating valsartan HCT with no significant overall change in creatinine clearance.  She does have stage IIIa-B chronic kidney disease that has remained stable, creatinine was 1.3 even in May 2023 and I also reviewed labs that were just done by Dr. Sigmund Hazel yesterday, creatinine has been very stable.  Hence continue present medications.  Advised the patient to keep herself very well-hydrated.  - labetalol (NORMODYNE) 200 MG tablet; Take 1 tablet (200 mg total) by mouth 2 (two) times daily.  Dispense: 180 tablet; Refill: 3 - valsartan-hydrochlorothiazide (DIOVAN HCT) 80-12.5 MG tablet; Take 1 tablet by mouth every morning.  Dispense: 90 tablet; Refill: 3  2. PAD (peripheral artery disease) (HCC) Patient's PAD has remained stable.  She is presently on Plavix, advised her to discontinue aspirin and continue Plavix alone to reduce the risk of bleeding.  3. Stage 3b chronic kidney disease (HCC) Stage IIIb chronic kidney disease has remained stable, she is appropriately on an ARB at low-dose of valsartan HCT, continue the same.  4. Hypercholesterolemia External labs reviewed, lipids under excellent control, continue present medications.  No changes were done today.  I will see her back in 6 months.    Yates Decamp, MD, [facility] 08/14/2022, 11:16  AM Office: [phone] Fax: [phone] Pager: [phone]

## 2022-09-24 DIAGNOSIS — E119 Type 2 diabetes mellitus without complications: Secondary | ICD-10-CM | POA: Diagnosis not present

## 2022-10-01 ENCOUNTER — Other Ambulatory Visit: Payer: Self-pay | Admitting: Cardiology

## 2022-10-01 DIAGNOSIS — Z23 Encounter for immunization: Secondary | ICD-10-CM | POA: Diagnosis not present

## 2022-10-01 DIAGNOSIS — E7800 Pure hypercholesterolemia, unspecified: Secondary | ICD-10-CM

## 2022-10-02 ENCOUNTER — Other Ambulatory Visit: Payer: Self-pay | Admitting: Cardiology

## 2022-10-02 DIAGNOSIS — I10 Essential (primary) hypertension: Secondary | ICD-10-CM

## 2022-10-20 ENCOUNTER — Other Ambulatory Visit: Payer: Self-pay | Admitting: Cardiology

## 2022-10-20 DIAGNOSIS — I10 Essential (primary) hypertension: Secondary | ICD-10-CM

## 2022-11-05 DIAGNOSIS — G629 Polyneuropathy, unspecified: Secondary | ICD-10-CM | POA: Diagnosis not present

## 2022-11-05 DIAGNOSIS — E1142 Type 2 diabetes mellitus with diabetic polyneuropathy: Secondary | ICD-10-CM | POA: Diagnosis not present

## 2022-11-05 DIAGNOSIS — N1832 Chronic kidney disease, stage 3b: Secondary | ICD-10-CM | POA: Diagnosis not present

## 2022-11-05 DIAGNOSIS — Z6824 Body mass index (BMI) 24.0-24.9, adult: Secondary | ICD-10-CM | POA: Diagnosis not present

## 2022-11-05 DIAGNOSIS — I129 Hypertensive chronic kidney disease with stage 1 through stage 4 chronic kidney disease, or unspecified chronic kidney disease: Secondary | ICD-10-CM | POA: Diagnosis not present

## 2022-11-19 DIAGNOSIS — H401132 Primary open-angle glaucoma, bilateral, moderate stage: Secondary | ICD-10-CM | POA: Diagnosis not present

## 2023-01-07 ENCOUNTER — Other Ambulatory Visit: Payer: Self-pay | Admitting: Cardiology

## 2023-01-07 DIAGNOSIS — I10 Essential (primary) hypertension: Secondary | ICD-10-CM

## 2023-02-03 ENCOUNTER — Other Ambulatory Visit: Payer: Self-pay | Admitting: Cardiology

## 2023-02-03 DIAGNOSIS — E7800 Pure hypercholesterolemia, unspecified: Secondary | ICD-10-CM

## 2023-02-11 DIAGNOSIS — E7800 Pure hypercholesterolemia, unspecified: Secondary | ICD-10-CM | POA: Diagnosis not present

## 2023-02-11 DIAGNOSIS — N1832 Chronic kidney disease, stage 3b: Secondary | ICD-10-CM | POA: Diagnosis not present

## 2023-02-11 DIAGNOSIS — E1142 Type 2 diabetes mellitus with diabetic polyneuropathy: Secondary | ICD-10-CM | POA: Diagnosis not present

## 2023-02-13 ENCOUNTER — Ambulatory Visit: Payer: Self-pay | Admitting: Cardiology

## 2023-02-24 DIAGNOSIS — Z0000 Encounter for general adult medical examination without abnormal findings: Secondary | ICD-10-CM | POA: Diagnosis not present

## 2023-03-10 DIAGNOSIS — H401131 Primary open-angle glaucoma, bilateral, mild stage: Secondary | ICD-10-CM | POA: Diagnosis not present

## 2023-03-28 ENCOUNTER — Encounter: Payer: Self-pay | Admitting: Cardiology

## 2023-04-18 ENCOUNTER — Ambulatory Visit: Payer: Medicare Other | Admitting: Cardiology

## 2023-05-09 ENCOUNTER — Other Ambulatory Visit: Payer: Self-pay

## 2023-05-09 DIAGNOSIS — I739 Peripheral vascular disease, unspecified: Secondary | ICD-10-CM

## 2023-05-09 MED ORDER — CLOPIDOGREL BISULFATE 75 MG PO TABS
75.0000 mg | ORAL_TABLET | Freq: Every day | ORAL | 0 refills | Status: DC
Start: 2023-05-09 — End: 2023-08-19

## 2023-06-10 ENCOUNTER — Other Ambulatory Visit: Payer: Self-pay

## 2023-06-10 ENCOUNTER — Encounter: Payer: Self-pay | Admitting: Cardiology

## 2023-06-10 ENCOUNTER — Other Ambulatory Visit (HOSPITAL_COMMUNITY): Payer: Self-pay

## 2023-06-10 ENCOUNTER — Ambulatory Visit: Attending: Cardiology | Admitting: Cardiology

## 2023-06-10 VITALS — BP 160/55 | HR 73 | Resp 16 | Ht 63.0 in | Wt 140.0 lb

## 2023-06-10 DIAGNOSIS — I739 Peripheral vascular disease, unspecified: Secondary | ICD-10-CM

## 2023-06-10 DIAGNOSIS — I10 Essential (primary) hypertension: Secondary | ICD-10-CM

## 2023-06-10 DIAGNOSIS — M1712 Unilateral primary osteoarthritis, left knee: Secondary | ICD-10-CM

## 2023-06-10 DIAGNOSIS — J301 Allergic rhinitis due to pollen: Secondary | ICD-10-CM

## 2023-06-10 DIAGNOSIS — E7800 Pure hypercholesterolemia, unspecified: Secondary | ICD-10-CM

## 2023-06-10 DIAGNOSIS — N1831 Chronic kidney disease, stage 3a: Secondary | ICD-10-CM

## 2023-06-10 MED ORDER — FEXOFENADINE HCL 60 MG PO TABS
60.0000 mg | ORAL_TABLET | Freq: Two times a day (BID) | ORAL | 0 refills | Status: AC
  Filled 2023-06-10: qty 60, 30d supply, fill #0

## 2023-06-10 MED ORDER — ISOSORBIDE DINITRATE 30 MG PO TABS
30.0000 mg | ORAL_TABLET | Freq: Three times a day (TID) | ORAL | 2 refills | Status: DC
Start: 2023-06-10 — End: 2023-09-19
  Filled 2023-06-10: qty 90, 30d supply, fill #0

## 2023-06-10 MED ORDER — DICLOFENAC SODIUM 1 % EX GEL
2.0000 g | Freq: Four times a day (QID) | CUTANEOUS | 0 refills | Status: AC
  Filled 2023-06-10: qty 200, 25d supply, fill #0

## 2023-06-10 NOTE — Progress Notes (Signed)
 Cardiology Office Note:  .   Date:  06/10/2023  ID:  Angela Benitez, DOB 1933/11/12, MRN [id number] PCP: Perley Bradley, MD  [facility] Providers Cardiologist:  Knox Perl, MD   History of Present Illness: .   Angela Benitez is a 89 y.o. AA female with hypertension, diabetes mellitus type 2 and hypercholesterolemia and PAD with multiple interventions in the past, last successful revascularization of the right SFA and popliteal and TP trunk followed by drug-coated balloon angioplasty on 09/07/2019 for CLI.   Discussed the use of AI scribe software for clinical note transcription with the patient, who gave verbal consent to proceed.  History of Present Illness Angela Benitez is an 89 year old female with hypertension and peripheral arterial disease who presents for a cardiovascular follow-up. She has slightly elevated blood pressure readings at home, which she attributes to stress from her brother's hospitalization. Her antihypertensive regimen includes hydralazine  50 mg three times a day, labetalol  200 mg twice a day, amlodipine  10 mg once a day, and valsartan  HCT 80/12.5 mg once a day.  She experiences arthritis pain in her left knee, which limits her walking. She walks around the house and yard but is limited by knee pain. There is no leg pain when walking, except for joint pain in her left knee. She denies claudication, chest pain, or shortness of breath.  Her current medications for cholesterol management include Crestor  20 mg once a day.  Labs   External Labs:  Care Everywhere PCP labs 02/11/2023:  A1c 7.6%.  Serum glucose 152 mg, BUN 32, creatinine 1.25, EGFR 41 mL, potassium 4.5, LFTs normal.  Total cholesterol 149, triglycerides 57, HDL 65, LDL 72.  Labs 08/14/2022:  TSH normal at 1.52.  ROS  Review of Systems  HENT:  Positive for congestion.   Cardiovascular:  Negative for chest pain, dyspnea on exertion and leg swelling.  Musculoskeletal:  Positive for joint pain.     Physical Exam:   VS:  BP (!) 173/55 (BP Location: Left Arm, Patient Position: Sitting, Cuff Size: Normal)   Pulse 73   Resp 16   Ht 5\' 3"  (1.6 m)   Wt 140 lb (63.5 kg)   SpO2 98%   BMI 24.80 kg/m    Wt Readings from Last 3 Encounters:  06/10/23 140 lb (63.5 kg)  08/14/22 129 lb (58.5 kg)  07/03/22 124 lb 9.6 oz (56.5 kg)    Physical Exam Neck:     Vascular: Carotid bruit (bilateral) present. No JVD.  Cardiovascular:     Rate and Rhythm: Normal rate and regular rhythm.     Pulses: Intact distal pulses.          Dorsalis pedis pulses are 1+ on the right side and 1+ on the left side.       Posterior tibial pulses are 0 on the right side and 0 on the left side.     Heart sounds: Normal heart sounds. No murmur heard.    No gallop.  Pulmonary:     Effort: Pulmonary effort is normal.     Breath sounds: Normal breath sounds.  Abdominal:     General: Bowel sounds are normal.     Palpations: Abdomen is soft.  Musculoskeletal:     Right lower leg: No edema.     Left lower leg: No edema.    Studies Reviewed: Aaron Aas     EKG:         Medications and allergies  Allergies  Allergen Reactions   Metformin  And Related Diarrhea     Current Outpatient Medications:    acetaminophen  (TYLENOL ) 500 MG tablet, Take 500 mg by mouth every 6 (six) hours as needed for moderate pain or headache., Disp: , Rfl:    amLODipine  (NORVASC ) 10 MG tablet, Take 1 tablet by mouth once daily, Disp: 90 tablet, Rfl: 2   clopidogrel  (PLAVIX ) 75 MG tablet, Take 1 tablet (75 mg total) by mouth daily., Disp: 100 tablet, Rfl: 0   gabapentin  (NEURONTIN ) 300 MG capsule, Take 300 mg by mouth at bedtime., Disp: , Rfl:    glipiZIDE (GLUCOTROL XL) 5 MG 24 hr tablet, Take 5 mg by mouth 2 (two) times daily., Disp: , Rfl:    hydrALAZINE  (APRESOLINE ) 50 MG tablet, TAKE 1 TABLET BY MOUTH THREE TIMES DAILY, Disp: 270 tablet, Rfl: 2   labetalol  (NORMODYNE ) 200 MG tablet, Take 1 tablet (200 mg total) by mouth 2 (two)  times daily., Disp: 180 tablet, Rfl: 3   latanoprost  (XALATAN ) 0.005 % ophthalmic solution, Place 1 drop into both eyes at bedtime. , Disp: , Rfl:    metFORMIN  (GLUCOPHAGE -XR) 500 MG 24 hr tablet, Take 500 mg by mouth daily., Disp: , Rfl:    Multiple Vitamin (MULTIVITAMIN WITH MINERALS) TABS tablet, Take 1 tablet by mouth daily., Disp: , Rfl:    rosuvastatin  (CRESTOR ) 20 MG tablet, Take 1 tablet by mouth once daily, Disp: 90 tablet, Rfl: 3   valsartan -hydrochlorothiazide  (DIOVAN  HCT) 80-12.5 MG tablet, Take 1 tablet by mouth every morning., Disp: 90 tablet, Rfl: 3   No orders of the defined types were placed in this encounter.    There are no discontinued medications.   ASSESSMENT AND PLAN: .      ICD-10-CM   1. PAD (peripheral artery disease) (HCC)  I73.9 EKG 12-Lead    2. Stage 3a chronic kidney disease (HCC)  N18.31     3. Primary hypertension  I10     4. Hypercholesterolemia  E78.00       Assessment and Plan Assessment & Plan Hypertension   Blood pressure remains elevated despite multiple antihypertensive medications, including hydralazine , labetalol , amlodipine , and valsartan  HCT. Isosorbide  dinitrate is considered to enhance circulation and improve control. Discussed potential initial headache side effect. The combination of hydralazine  and isosorbide  dinitrate is expected to synergistically improve blood pressure. Prescribe isosorbide  dinitrate 30 mg, one tablet three times daily. Monitor blood pressure at home. Continue current antihypertensive regimen. Send prescription to local pharmacy.  PAD She has no significant symptoms of claudication, vascular examination remains unchanged and there is no clinical evidence of limb threatening ischemia.  Advised her to increase her physical activity as tolerated and discussed collateralization of vessel occlusion or significant disease.  Presently on Plavix , continue the same.  No bleeding diathesis.  Chronic kidney disease, stage 3a    Kidney function is classified as stage 3a, managed with valsartan  HCT to protect kidney function.Reviewed BMP from PCP, stable function.   Hyperlipidemia   Cholesterol levels are well controlled with rosuvastatin  20 mg daily. LDL is at 72, which is satisfactory. Continue rosuvastatin  20 mg once daily.  Osteoarthritis of left knee   Left knee pain and swelling limit mobility. Voltaren gel is recommended for local application to reduce pain and swelling without affecting blood pressure. Supportive knee sleeves may also help decrease swelling. Prescribe Voltaren gel for application three to four times a day. Consider use of supportive knee sleeves.  Seasonal allergies   Congestion and roaring  in ears are likely due to seasonal allergies. Allegra is prescribed to manage symptoms. Steam inhalation is recommended to alleviate sinus congestion. If symptoms persist, contact PCP for possible ENT referral to prevent infection from congestion. Prescribe Allegra 60 mg twice a day. Advise steam inhalation daily for one week. If symptoms persist, contact PCP for possible ENT referral.   Signed,  Knox Perl, MD, [facility] 06/10/2023, 8:50 AM [facility] [address] [place], [place] [address] Phone: [phone]. Fax:  [phone]

## 2023-06-10 NOTE — Patient Instructions (Addendum)
 Medication Instructions:  Your physician has recommended you make the following change in your medication:  Start Isordil  30 mg by mouth 3 times daily   *If you need a refill on your cardiac medications before your next appointment, please call your pharmacy*  Lab Work: none If you have labs (blood work) drawn today and your tests are completely normal, you will receive your results only by: MyChart Message (if you have MyChart) OR A paper copy in the mail If you have any lab test that is abnormal or we need to change your treatment, we will call you to review the results.  Testing/Procedures: none  Follow-Up: At [facility], you and your health needs are our priority.  As part of our continuing mission to provide you with exceptional heart care, our providers are all part of one team.  This team includes your primary Cardiologist (physician) and Advanced Practice Providers or APPs (Physician Assistants and Nurse Practitioners) who all work together to provide you with the care you need, when you need it.  Your next appointment:   12 month(s)  Provider:   Knox Perl, MD    We recommend signing up for the patient portal called "MyChart".  Sign up information is provided on this After Visit Summary.  MyChart is used to connect with patients for Virtual Visits (Telemedicine).  Patients are able to view lab/test results, encounter notes, upcoming appointments, etc.  Non-urgent messages can be sent to your provider as well.   To learn more about what you can do with MyChart, go to [email or web address].   Other Instructions

## 2023-06-13 ENCOUNTER — Other Ambulatory Visit (HOSPITAL_COMMUNITY): Payer: Self-pay

## 2023-07-09 DIAGNOSIS — H401132 Primary open-angle glaucoma, bilateral, moderate stage: Secondary | ICD-10-CM | POA: Diagnosis not present

## 2023-07-19 ENCOUNTER — Other Ambulatory Visit: Payer: Self-pay | Admitting: Cardiology

## 2023-07-19 DIAGNOSIS — I10 Essential (primary) hypertension: Secondary | ICD-10-CM

## 2023-08-15 ENCOUNTER — Encounter: Payer: Self-pay | Admitting: Advanced Practice Midwife

## 2023-08-17 ENCOUNTER — Other Ambulatory Visit: Payer: Self-pay | Admitting: Cardiology

## 2023-08-17 DIAGNOSIS — I739 Peripheral vascular disease, unspecified: Secondary | ICD-10-CM

## 2023-08-18 ENCOUNTER — Other Ambulatory Visit: Payer: Self-pay | Admitting: Cardiology

## 2023-08-18 DIAGNOSIS — N1832 Chronic kidney disease, stage 3b: Secondary | ICD-10-CM | POA: Diagnosis not present

## 2023-08-18 DIAGNOSIS — G629 Polyneuropathy, unspecified: Secondary | ICD-10-CM | POA: Diagnosis not present

## 2023-08-18 DIAGNOSIS — I10 Essential (primary) hypertension: Secondary | ICD-10-CM

## 2023-08-18 DIAGNOSIS — M2550 Pain in unspecified joint: Secondary | ICD-10-CM | POA: Diagnosis not present

## 2023-08-18 DIAGNOSIS — I129 Hypertensive chronic kidney disease with stage 1 through stage 4 chronic kidney disease, or unspecified chronic kidney disease: Secondary | ICD-10-CM | POA: Diagnosis not present

## 2023-08-18 DIAGNOSIS — E1142 Type 2 diabetes mellitus with diabetic polyneuropathy: Secondary | ICD-10-CM | POA: Diagnosis not present

## 2023-09-13 ENCOUNTER — Other Ambulatory Visit: Payer: Self-pay | Admitting: Cardiology

## 2023-09-13 DIAGNOSIS — I10 Essential (primary) hypertension: Secondary | ICD-10-CM

## 2023-09-19 ENCOUNTER — Other Ambulatory Visit: Payer: Self-pay

## 2023-09-19 DIAGNOSIS — I10 Essential (primary) hypertension: Secondary | ICD-10-CM

## 2023-09-19 MED ORDER — ISOSORBIDE DINITRATE 30 MG PO TABS
30.0000 mg | ORAL_TABLET | Freq: Three times a day (TID) | ORAL | 2 refills | Status: AC
Start: 2023-09-19 — End: ?

## 2023-10-01 ENCOUNTER — Other Ambulatory Visit: Payer: Self-pay | Admitting: Cardiology

## 2023-10-01 DIAGNOSIS — I10 Essential (primary) hypertension: Secondary | ICD-10-CM

## 2023-11-04 DIAGNOSIS — E119 Type 2 diabetes mellitus without complications: Secondary | ICD-10-CM | POA: Diagnosis not present

## 2024-02-27 ENCOUNTER — Other Ambulatory Visit: Payer: Self-pay | Admitting: Cardiology

## 2024-02-27 DIAGNOSIS — E7800 Pure hypercholesterolemia, unspecified: Secondary | ICD-10-CM
